# Patient Record
Sex: Male | Born: 1975 | Race: White | Hispanic: No | Marital: Married | Smoking: Never smoker
Health system: Southern US, Community
[De-identification: ages and names within clinical notes are randomized; demographics above are authoritative.]

## PROBLEM LIST (undated history)

## (undated) DIAGNOSIS — T7840XA Allergy, unspecified, initial encounter: Secondary | ICD-10-CM

## (undated) HISTORY — DX: Allergy, unspecified, initial encounter: T78.40XA

## (undated) HISTORY — PX: NASAL SINUS SURGERY: SHX719

## (undated) HISTORY — PX: HERNIA REPAIR: SHX51

---

## 2002-12-06 ENCOUNTER — Ambulatory Visit (HOSPITAL_COMMUNITY): Admission: RE | Admit: 2002-12-06 | Discharge: 2002-12-06 | Payer: Self-pay | Admitting: Surgery

## 2004-02-08 ENCOUNTER — Ambulatory Visit (HOSPITAL_BASED_OUTPATIENT_CLINIC_OR_DEPARTMENT_OTHER): Admission: RE | Admit: 2004-02-08 | Discharge: 2004-02-08 | Payer: Self-pay | Admitting: Orthopedic Surgery

## 2008-02-06 ENCOUNTER — Encounter: Admission: RE | Admit: 2008-02-06 | Discharge: 2008-02-06 | Payer: Self-pay | Admitting: Orthopedic Surgery

## 2010-08-22 ENCOUNTER — Other Ambulatory Visit: Payer: Self-pay | Admitting: Otolaryngology

## 2010-08-22 ENCOUNTER — Ambulatory Visit
Admission: RE | Admit: 2010-08-22 | Discharge: 2010-08-22 | Disposition: A | Discharge status: Home / Self Care | Payer: BC Managed Care – PPO | Source: Ambulatory Visit | Attending: Otolaryngology | Admitting: Otolaryngology

## 2010-08-22 DIAGNOSIS — J329 Chronic sinusitis, unspecified: Secondary | ICD-10-CM

## 2010-09-21 NOTE — Op Note (Signed)
NAME:  CERRONE, DEBOLD NO.:  [id number]   MEDICAL RECORD NO.:  [id number]                   PATIENT TYPE:  AMB   LOCATION:  DAY                                  FACILITY:  [facility]   PHYSICIAN:  Abigail Miyamoto, M.D.              DATE OF BIRTH:  21-Oct-1975   DATE OF PROCEDURE:  12/06/2002  DATE OF DISCHARGE:                                 OPERATIVE REPORT   PREOPERATIVE DIAGNOSIS:  Right inguinal hernia.   POSTOPERATIVE DIAGNOSIS:  Right inguinal hernia.   PROCEDURE:  Laparoscopic right inguinal hernia repair with mesh.   SURGEON:  Douglas A. Magnus Ivan, M.D.   ANESTHESIA:  General endotracheal anesthesia and 0.25% Marcaine.   ESTIMATED BLOOD LOSS:  Minimal.   PROCEDURE IN DETAIL:  The patient was brought to the operating room and  identified as Wyatt Clark.  He was placed supine on the operating table,  and general anesthesia was induced.  His abdomen was then prepped and draped  in the usual sterile fashion.  Using a #15 blade, a small transverse  incision was made below the umbilicus.  The incision was carried down to the  fascia which was then opened with a scalpel.  A hemostat was used to  identify the rectus muscles.  The rectus sheath were then elevated.  The  dissecting balloon was then passed underneath the rectus sheath and  manipulated toward the pelvis.  The dissecting balloon was then insufflated  under direct vision, dissecting out the preperitoneal space.  Excellent  dissection of the preperitoneal space appeared to be achieved.  The  dissecting balloon was then removed, and the smaller balloon port was placed  at the incision at the umbilicus.  Insufflation with carbon dioxide was then  begun, insufflating the preperitoneal space.  Next, two 5 mm ports were  placed in the patient's midline under direct vision.  Dissection was then  carried out in the right inguinal area.  The testicular cord and its  structures were easily  identified.  The patient was found to have an  indirect hernia sac which was easily reduced and separated from the  remaining cord structures.  The external ring also appeared to be quite  large in size.  No direct hernia defect was identified.  Once Cooper's  ligament was identified, a piece of Bard precut mesh was brought onto the  field.  The mesh was then placed through the port at the umbilicus.  The  mesh was then opened up and placed in an onlay fashion over the cord  structures, hernia defect, and ring.  The mesh was then tacked in place with  the surgical tacker, tacking it to Cooper's ligament, up the medial  abdominal wall, and out laterally.  Again, an excellent coverage of the cord  structures appeared to be achieved.  At this point, all ports were removed,  and the preperitoneal space was  seen to collapse appropriately.  The port at  the umbilicus then removed likewise.  The fascia at the umbilicus was then  closed with a figure-of-eight 0 Vicryl suture.  All incisions were then  anesthetized with 0.25% Marcaine.  An ilioinguinal nerve block was also  placed with 0.25% Marcaine.  All incisions were then closed with 4-0  Monocryl subcuticular sutures.  Steri-Strips, gauze, and tape were then  applied.  The patient tolerated the procedure well.  All sponge, needle, and  instrument counts were correct at the end of the procedure.  The patient was  then extubated in the operating room and taken in stable condition to the  recovery room.                                              Abigail Miyamoto, M.D.   DB/MEDQ  D:  12/06/2002  T:  12/06/2002  Job:  [id number]

## 2010-09-21 NOTE — Op Note (Signed)
NAME:  Wyatt Clark, Wyatt Clark               ACCOUNT NO.:  [id number]   MEDICAL RECORD NO.:  [id number]          PATIENT TYPE:  AMB   LOCATION:  DSC                          FACILITY:  [facility]   PHYSICIAN:  Mila Homer. Sherlean Foot, M.D. DATE OF BIRTH:  04-19-1976   DATE OF PROCEDURE:  02/08/2004  DATE OF DISCHARGE:                                 OPERATIVE REPORT   PREOPERATIVE DIAGNOSIS:  Right knee internal derangement.   POSTOPERATIVE DIAGNOSIS:  Right knee partial anterior cruciate ligament tear  and lateral meniscal tear.   OPERATION PERFORMED:   SURGEON:  Mila Homer. Sherlean Foot, M.D.   ASSISTANT:  None.   ANESTHESIA:  General.   INDICATIONS FOR PROCEDURE:  The patient is a 35 year old white male with  mechanical symptoms but a normal MRI scan.  This was primarily a diagnostic  arthroscopy because the patient could not tolerate the pain, the mechanical  symptoms and instability any further.  Informed consent was obtained.   DESCRIPTION OF PROCEDURE:  The patient was laid supine and administered  general LMA anesthesia.  Right lower extremity was prepped and draped in the  usual sterile fashion.  Inferolateral and inferomedial portals were created  with a #11 blade, blunt trocar and cannula.  Diagnostic arthroscopy  revealed a completely normal patellofemoral joint.  The medial compartment  was also completely normal.  As I went into the notch, I noticed that the  ACL appeared to be lying down on the PCL and there was almost somewhat of an  empty lateral wall sign. There was also some redundant tissue in the  anterolateral bundle.  As I went into the figure four position, I noticed  that it sort of flopped into the joint and this was definitely indicative of  some partial thickness tearing.  In my mind, I think that the ACL probably  at least 50 to 75% tore and scarred down within the synovial sheath down to  the PCL offering some artificial stability which is why it was not picked up  on MRI or  physical examination.  Under direct visualization with the  arthroscopy in the knee, there was really on half grade to 1+ grade positive  Lachman.  I then notice a radial tear in the midportion of the lateral  meniscus.  I used straight and curved basket forceps and a [place]  shaver to perform a partial lateral meniscectomy.  I also used the  ArthroCare shrinkage capture wand to complete the meniscectomy back to a  stable rim of tissue.  I then shrunk the anterolateral bundle of the ACL  mainly because of its redundancy, I felt it was causing impingement, but  this also afforded some added stability to the ACL.  I did not make an  effort to go to the posterior bundle however.  At this point I irrigated  closed with interrupted 4-0 nylon sutures, dressed with Adaptic, 4 x 4s,  sterile Webril and an Ace wrap.   COMPLICATIONS:  None.   DRAINS:  None.       SDL/MEDQ  D:  02/08/2004  T:  02/08/2004  Job:  [id number]

## 2012-09-09 ENCOUNTER — Telehealth: Payer: Self-pay | Admitting: Family Medicine

## 2012-09-09 NOTE — Telephone Encounter (Signed)
Pt's allergist has already called him in something

## 2012-11-11 ENCOUNTER — Encounter: Payer: Self-pay | Admitting: Family Medicine

## 2012-11-11 ENCOUNTER — Ambulatory Visit (INDEPENDENT_AMBULATORY_CARE_PROVIDER_SITE_OTHER): Payer: BC Managed Care – PPO | Admitting: Family Medicine

## 2012-11-11 VITALS — BP 138/90 | HR 100 | Temp 97.7°F | Resp 20 | Wt 208.0 lb

## 2012-11-11 DIAGNOSIS — F429 Obsessive-compulsive disorder, unspecified: Secondary | ICD-10-CM

## 2012-11-11 DIAGNOSIS — F439 Reaction to severe stress, unspecified: Secondary | ICD-10-CM

## 2012-11-11 DIAGNOSIS — J309 Allergic rhinitis, unspecified: Secondary | ICD-10-CM | POA: Insufficient documentation

## 2012-11-11 DIAGNOSIS — I10 Essential (primary) hypertension: Secondary | ICD-10-CM

## 2012-11-11 DIAGNOSIS — Z733 Stress, not elsewhere classified: Secondary | ICD-10-CM

## 2012-11-11 MED ORDER — ALPRAZOLAM 0.5 MG PO TABS: 0.5000 mg | ORAL_TABLET | Freq: Every evening | ORAL | Status: DC | PRN

## 2012-11-11 MED ORDER — ESCITALOPRAM OXALATE 10 MG PO TABS: 10.0000 mg | ORAL_TABLET | Freq: Every day | ORAL | Status: DC

## 2012-11-11 NOTE — Progress Notes (Signed)
  Subjective:    Patient ID: Wyatt Clark, male    DOB: 1976-02-02, 37 y.o.   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 He was in severe pain at the time. 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  Today his blood pressure is Wyatt Clark elevated at 138/90. He denies chest pain shortness of breath or dyspnea on exertion. However he reports depression, anxiety, and insomnia. Due to the recent loss of his business he cannot sleep at night. He is under tremendous stress. 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  He states that his OCD and cannot turn his mind off at night. He stress and recent back pain is what is elevating his blood pressure. Review of his past medical records showed normal blood pressures. He does have a family history of hypertension on his father's side.  He's asking for help in treating his stress. Past Medical History  Diagnosis Date  . Allergy    No current outpatient prescriptions on file prior to visit.   No current facility-administered medications on file prior to visit.   No Known Allergies History   Social History  . Marital Status: Single    Spouse Name: N/A    Number of Children: N/A  . Years of Education: N/A   Occupational History  . Not on file.   Social History Main Topics  . Smoking status: Never Smoker   . Smokeless tobacco: Not on file  . Alcohol Use: Not on file  . Drug Use: Not on file  . Sexually Active: Not on file   Other Topics Concern  . Not on file   Social History Narrative  . No narrative on file      Review of Systems  All other systems reviewed and are negative.       Objective:   Physical Exam  Vitals reviewed. Constitutional: He is oriented to person, place, and time. He appears well-developed and well-nourished.  Neck: Neck supple. No JVD present.  Cardiovascular: Normal rate, regular  rhythm and normal heart sounds.   No murmur heard. Pulmonary/Chest: Effort normal and breath sounds normal. No respiratory distress. He has no wheezes. He has no rales.  Lymphadenopathy:    He has no cervical adenopathy.  Neurological: He is alert and oriented to person, place, and time.          Assessment & Plan:  HTN (hypertension)  OCD (obsessive compulsive disorder)  Situational stress  I have asked the patient to get a blood pressure cuff and start recording his blood pressures daily. If they're consistently higher than 140/90 we will need to treat them. In the meantime we will try to address his situational stress and help him deal with his OCD tendencies. Start the patient on Lexapro 10 mg by mouth daily. I gave the patient Xanax 0.5 mg by mouth each bedtime when necessary insomnia. I will see the patient back in one month to check and see how his stress is improving. He denies any suicidal ideation. If his blood pressures remain elevated we will treat them. If they become emergently elevated greater than 160/100 we'll begin treatment immediately.

## 2012-12-14 ENCOUNTER — Ambulatory Visit: Payer: BC Managed Care – PPO | Admitting: Family Medicine

## 2013-01-01 ENCOUNTER — Ambulatory Visit: Payer: BC Managed Care – PPO | Admitting: Family Medicine

## 2013-05-18 ENCOUNTER — Ambulatory Visit (INDEPENDENT_AMBULATORY_CARE_PROVIDER_SITE_OTHER): Payer: BC Managed Care – PPO | Admitting: Physician Assistant

## 2013-05-18 ENCOUNTER — Encounter: Payer: Self-pay | Admitting: Physician Assistant

## 2013-05-18 VITALS — BP 134/90 | HR 84 | Temp 97.9°F | Resp 20 | Wt 204.0 lb

## 2013-05-18 DIAGNOSIS — J029 Acute pharyngitis, unspecified: Secondary | ICD-10-CM

## 2013-05-18 DIAGNOSIS — R509 Fever, unspecified: Secondary | ICD-10-CM

## 2013-05-18 DIAGNOSIS — J101 Influenza due to other identified influenza virus with other respiratory manifestations: Secondary | ICD-10-CM

## 2013-05-18 DIAGNOSIS — J111 Influenza due to unidentified influenza virus with other respiratory manifestations: Secondary | ICD-10-CM

## 2013-05-18 LAB — INFLUENZA A AND B
INFLUENZA B AG: NEGATIVE
Inflenza A Ag: POSITIVE — AB

## 2013-05-18 LAB — RAPID STREP SCREEN (MED CTR MEBANE ONLY): Streptococcus, Group A Screen (Direct): NEGATIVE

## 2013-05-18 MED ORDER — OSELTAMIVIR PHOSPHATE 75 MG PO CAPS: 75.0000 mg | ORAL_CAPSULE | Freq: Two times a day (BID) | ORAL | Status: DC

## 2013-05-18 NOTE — Progress Notes (Signed)
Patient ID: Isaias SakaiLeonard Ouk MRN: [id number], DOB: 03/26/1976, 38 y.o. Date of Encounter: 05/18/2013, 10:44 AM    Chief Complaint:  Chief Complaint  Patient presents with  . sick    fever, cough, congestion, sore throat  . Medication Refill    xanax     HPI: 38 y.o. year old white male reports that his was room with him last week with a viral sore throat. He took his son to his pediatrician who had said it was a virus and the sore throat did improve after just 3 days. Patient states that he also had some sore throat last week. However, he says that all of a sudden Sunday night which was 05/16/13, he suddenly felt terrible. Developed a fever of 101. Says that yesterday he continued to feel horrible all day with complete very weak with no energy and with it bad cough. Has had no head or nasal congestion no nasal mucus. Sore Throat is resolved since last week.   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 Says he has no problems falling asleep but frequently wakes up after just a couple hours of sleep. His mind starts taking about things with his business. Then has difficulty falling back to sleep. Does use Xanax at night and this does work but he is concerned about using this long-term. Office visit Tylenol PM works well but he is concerned about his liver.     Home Meds: See attached medication section for any medications that were entered at today's visit. The computer does not put those onto this list.The following list is a list of meds entered prior to today's visit.   Current Outpatient Prescriptions on File Prior to Visit  Medication Sig Dispense Refill  . ALPRAZolam (XANAX) 0.5 MG tablet Take 1 tablet (0.5 mg total) by mouth at bedtime as needed for sleep.  30 tablet  0  . cetirizine (ZYRTEC) 5 MG tablet Take 5 mg by mouth daily.      Marland Kitchen. escitalopram (LEXAPRO) 10 MG tablet Take 1 tablet (10 mg total) by mouth daily.  30 tablet  3   No current facility-administered medications  on file prior to visit.    Allergies: No Known Allergies    Review of Systems: See HPI for pertinent ROS. All other ROS negative.    Physical Exam: Blood pressure 134/90, pulse 84, temperature 97.9 F (36.6 C), temperature source Oral, resp. rate 20, weight 204 lb (92.534 kg)., There is no height on file to calculate BMI. General: WNWD WM. Appears in no acute distress. HEENT: Normocephalic, atraumatic, eyes without discharge, sclera non-icteric, nares are without discharge. Bilateral auditory canals clear, TM's are without perforation, pearly grey and translucent with reflective cone of light bilaterally. Oral cavity moist, posterior pharynx without exudate, erythema, peritonsillar abscess.  Neck: Supple. No thyromegaly. No lymphadenopathy. Lungs: Clear bilaterally to auscultation without wheezes, rales, or rhonchi. Breathing is unlabored. Heart: Regular rhythm. No murmurs, rubs, or gallops. Msk:  Strength and tone normal for age. Extremities/Skin: Warm and dry. No clubbing or cyanosis. No edema. No rashes or suspicious lesions. Neuro: Alert and oriented X 3. Moves all extremities spontaneously. Gait is normal. CNII-XII grossly in tact. Psych:  Responds to questions appropriately with a normal affect.   Results for orders placed in visit on 05/18/13  RAPID STREP SCREEN      Result Value Range   Source THROAT     Streptococcus, Group A Screen (Direct) NEG  NEGATIVE  INFLUENZA A AND  B      Result Value Range   Source-INFBD NASAL     Inflenza A Ag POS (*) Negative   Influenza B Ag NEG  Negative     ASSESSMENT AND PLAN:  38 y.o. year old male with  1. Influenza A - oseltamivir (TAMIFLU) 75 MG capsule; Take 1 capsule (75 mg total) by mouth 2 (two) times daily.  Dispense: 10 capsule; Refill: 0  2. Fever, unspecified - Rapid Strep Screen - Influenza a and b  3. Sore throat - Rapid Strep Screen - Influenza a and b  Start the Tamiflu immediately. Take as directed and complete.  Can use Tylenol or Motrin as needed for fever and aches and pains. Follow up if symptoms worsen significantly or do not improve and resolve over the upcoming week.   Anxiety/Insomnia:  Told him to take plain Benadryl at night rather than Tylenol PM. See history of present illness.  [address] [place], [place], [facility] 05/18/2013 10:44 AM

## 2013-06-04 ENCOUNTER — Telehealth: Payer: Self-pay | Admitting: Family Medicine

## 2013-06-04 ENCOUNTER — Encounter: Payer: Self-pay | Admitting: Family Medicine

## 2013-06-04 ENCOUNTER — Ambulatory Visit (INDEPENDENT_AMBULATORY_CARE_PROVIDER_SITE_OTHER): Payer: BC Managed Care – PPO | Admitting: Family Medicine

## 2013-06-04 VITALS — BP 140/100 | HR 78 | Temp 97.5°F | Resp 18 | Ht 69.5 in | Wt 210.0 lb

## 2013-06-04 DIAGNOSIS — J019 Acute sinusitis, unspecified: Secondary | ICD-10-CM

## 2013-06-04 DIAGNOSIS — J029 Acute pharyngitis, unspecified: Secondary | ICD-10-CM

## 2013-06-04 DIAGNOSIS — R030 Elevated blood-pressure reading, without diagnosis of hypertension: Secondary | ICD-10-CM | POA: Insufficient documentation

## 2013-06-04 LAB — RAPID STREP SCREEN (MED CTR MEBANE ONLY): STREPTOCOCCUS, GROUP A SCREEN (DIRECT): NEGATIVE

## 2013-06-04 MED ORDER — MAGIC MOUTHWASH W/LIDOCAINE: 5.0000 mL | Freq: Three times a day (TID) | ORAL | Status: DC | PRN

## 2013-06-04 MED ORDER — AMOXICILLIN-POT CLAVULANATE 875-125 MG PO TABS: 1.0000 | ORAL_TABLET | Freq: Two times a day (BID) | ORAL | Status: DC

## 2013-06-04 NOTE — Assessment & Plan Note (Signed)
Magic mouthwash given his strep was negative Augmentin will also help

## 2013-06-04 NOTE — Assessment & Plan Note (Signed)
With recurrent infection we'll go ahead and place him on Augmentin he can also continue Mucinex

## 2013-06-04 NOTE — Telephone Encounter (Signed)
Need clarification on strength of magic mouthwash.  Call pharmacy to clarify formula

## 2013-06-04 NOTE — Telephone Encounter (Signed)
Still sick.  Continues to have sore throat, congestion, head stuffy.  Was in bed all last week.  No fever.  Glands are swollen.  Offered appt for today to see provider

## 2013-06-04 NOTE — Patient Instructions (Signed)
Take antibiotics Continue mucinex Magic mouthwash F/U 8 WEEKS FOR BLOOD PRESSURE WITH DR. PICKWARD

## 2013-06-04 NOTE — Telephone Encounter (Signed)
Contacted pharmacy and medication was clarified

## 2013-06-04 NOTE — Telephone Encounter (Signed)
Call and clarify- magic mouthwash 5ml po TID prn  60ml

## 2013-06-04 NOTE — Progress Notes (Signed)
   Subjective:    Patient ID: Wyatt Clark, male    DOB: 04/04/1976, 38 y.o.   MRN: [id number]  HPI  Patient here with sinus drainage and sore throat for the past week. He was actually seen 3 weeks ago with sore throat he is exposed to strep throat however strep was negative he did have a positive influenza A and was started on Tamiflu. He completed the Tamiflu was feeling better for about 2 or 3 days and then he felt like his sinuses started to drain and a sore throat returned but worse. He's not had any fever. He has been taking decongestants for her sinuses for the past 2 days and using Mucinex as well.  Review of Systems  GEN- denies fatigue, fever, weight loss,weakness, recent illness HEENT- denies eye drainage, change in vision, +nasal discharge, CVS- denies chest pain, palpitations RESP- denies SOB,+ cough, wheeze Neuro- denies headache, dizziness, syncope, seizure activity      Objective:   Physical Exam  GEN- NAD, alert and oriented x3 HEENT- PERRL, EOMI, non injected sclera, pink conjunctiva, MMM, oropharynx mild injection, TM clear bilat no effusion,  maxillary sinus tenderness, +  Nasal drainage  Neck- Supple,  + anterior LAD CVS- RRR, no murmur RESP-CTAB EXT- No edema Pulses- Radial 2+        Assessment & Plan:

## 2013-06-04 NOTE — Assessment & Plan Note (Signed)
He has not been monitoring his blood pressure recently he has a lot of stress with his job which is noted by his PCP. Unfortunately is been on decongestants his elevation in blood pressure may be due to all the over-the-counter medications. I'll have him return in about 8 weeks with his PCP to have his blood pressure recheck he is also to check this at home

## 2013-06-14 ENCOUNTER — Telehealth: Payer: Self-pay | Admitting: Family Medicine

## 2013-06-14 MED ORDER — AZITHROMYCIN 250 MG PO TABS: ORAL_TABLET | ORAL | Status: DC

## 2013-06-14 NOTE — Telephone Encounter (Signed)
Pt called and states that after the antibx he is not feeling much better at all and wants to know if we can call him in a different antibx like Zpac or something. His glands are still swollen and painful, no energy and still congested. No fever.

## 2013-06-14 NOTE — Telephone Encounter (Signed)
Patient aware and med sent to pharm 

## 2013-06-14 NOTE — Telephone Encounter (Signed)
Okay to send in Zpak  Use mucinex for the congestion and nasal saline

## 2013-07-11 ENCOUNTER — Ambulatory Visit: Payer: BC Managed Care – PPO | Admitting: Family Medicine

## 2013-07-11 VITALS — BP 116/72 | HR 74 | Temp 98.5°F | Resp 16 | Ht 69.5 in | Wt 210.0 lb

## 2013-07-11 DIAGNOSIS — J019 Acute sinusitis, unspecified: Secondary | ICD-10-CM

## 2013-07-11 DIAGNOSIS — J309 Allergic rhinitis, unspecified: Secondary | ICD-10-CM

## 2013-07-11 DIAGNOSIS — J302 Other seasonal allergic rhinitis: Secondary | ICD-10-CM

## 2013-07-11 MED ORDER — AZELASTINE-FLUTICASONE 137-50 MCG/ACT NA SUSP: 1.0000 | Freq: Every day | NASAL | Status: DC

## 2013-07-11 MED ORDER — AZITHROMYCIN 250 MG PO TABS: ORAL_TABLET | ORAL | Status: DC

## 2013-07-11 NOTE — Progress Notes (Signed)
Chief Complaint:  Chief Complaint  Patient presents with  . sinus congestion    started yesturday   . sinus pressure  . Sore Throat  . Medication Refill    alprazolam ( usually filled at [facility])    HPI: Wyatt Clark is a 38 y.o. male who is here for: 1. Sinus infection- 2 days , sinus pain, mucus, sinus clogged, no energy, fevers or chills.. Gets allergy shots He used to get them once a month and now very infrequently. Ran out Dymista and gets sinus issues if he does not have that 2. HE wants ot get refills for Xanax , but he gets it filled [facility])  Past Medical History  Diagnosis Date  . Allergy    Past Surgical History  Procedure Laterality Date  . Hernia repair     History   Social History  . Marital Status: Married    Spouse Name: N/A    Number of Children: N/A  . Years of Education: N/A   Social History Main Topics  . Smoking status: Never Smoker   . Smokeless tobacco: None  . Alcohol Use: None  . Drug Use: None  . Sexual Activity: None   Other Topics Concern  . None   Social History Narrative  . None   Family History  Problem Relation Age of Onset  . Hypertension Father   . Diabetes Mother   . Cancer Sister    No Known Allergies Prior to Admission medications   Medication Sig Start Date End Date Taking? Authorizing Provider  DYMISTA 137-50 MCG/ACT SUSP Place 1 spray into the nose daily. 03/31/13  Yes Historical Provider, MD  ALPRAZolam Prudy Feeler) 0.5 MG tablet Take 1 tablet (0.5 mg total) by mouth at bedtime as needed for sleep. 11/11/12   Donita Brooks, MD  Alum & Mag Hydroxide-Simeth (MAGIC MOUTHWASH W/LIDOCAINE) SOLN Take 5 mLs by mouth 3 (three) times daily as needed for mouth pain. 06/04/13   Salley Scarlet, MD  amoxicillin-clavulanate (AUGMENTIN) 875-125 MG per tablet Take 1 tablet by mouth 2 (two) times daily. 06/04/13   Salley Scarlet, MD  azithromycin (ZITHROMAX) 250 MG tablet 2 tabs po x 1 day; 1  tab po qd x days 2-5 06/14/13   Donita Brooks, MD  cetirizine (ZYRTEC) 5 MG tablet Take 5 mg by mouth daily.    Historical Provider, MD  escitalopram (LEXAPRO) 10 MG tablet Take 1 tablet (10 mg total) by mouth daily. 11/11/12   Donita Brooks, MD     ROS: The patient denies fevers, chills, night sweats, unintentional weight loss, chest pain, palpitations, wheezing, dyspnea on exertion, nausea, vomiting, abdominal pain, dysuria, hematuria, melena, numbness, weakness, or tingling.   All other systems have been reviewed and were otherwise negative with the exception of those mentioned in the HPI and as above.    PHYSICAL EXAM: Filed Vitals:   07/11/13 1253  BP: 116/72  Pulse: 74  Temp: 98.5 F (36.9 C)  Resp: 16   Filed Vitals:   07/11/13 1253  Height: 5' 9.5" (1.765 m)  Weight: 210 lb (95.255 kg)   Body mass index is 30.58 kg/(m^2).  General: Alert, no acute distress HEENT:  Normocephalic, atraumatic, EOMI, PERRLA, tm nl, + sinus tenderness Cardiovascular: No pedal edema.  NSR,no murmurs Respiratory: No cyanosis, no use of accessory musculature, CTAB GI: No organomegaly, abdomen is Skin: No rashes. Neurologic: Facial musculature symmetric. Psychiatric: Patient is appropriate throughout  our interaction. Lymphatic: No cervical lymphadenopathy Musculoskeletal: Gait intact.   LABS: Results for orders placed in visit on 06/04/13  RAPID STREP SCREEN      Result Value Ref Range   Source THROAT     Streptococcus, Group A Screen (Direct) NEG  NEGATIVE     EKG/XRAY:   [facility]   ASSESSMENT/PLAN: Encounter Diagnoses  Name Primary?  . Acute sinusitis Yes  . Seasonal allergies    Rx Z pack and Dymista Advise to call [facility] for xanax refills F/u prn   Gross sideeffects, risk and benefits, and alternatives of medications d/w patient. Patient is aware that all medications have potential sideeffects and we are unable to predict  every sideeffect or drug-drug interaction that may occur.  Gracee Ratterree PHUONG, DO 07/11/2013 1:34 PM

## 2013-07-15 ENCOUNTER — Telehealth: Payer: Self-pay

## 2013-07-15 NOTE — Telephone Encounter (Signed)
PA needed for Dymista NS. Called pt who reported he has tried/failed nasacort, flonase,nasonex,astelin and rhinocort. All caused nosebleeds. dymista is effective w/out epistasis. Completed PA on covermymeds.

## 2013-07-21 NOTE — Telephone Encounter (Signed)
[facility] [employer] faxed notice that a PA was on file through 07/15/14. Called pharm to check if Rx would be covered and was told ins covers it, but co-pay is over $100. Advised pt of this and he stated there wasn't anything else that had worked for him. I suggested he call cust serv at [facility] [employer] to see if anything can be done to reduce the cost for him and let me know if we can help.

## 2013-10-15 ENCOUNTER — Ambulatory Visit (INDEPENDENT_AMBULATORY_CARE_PROVIDER_SITE_OTHER): Payer: BC Managed Care – PPO | Admitting: Family Medicine

## 2013-10-15 ENCOUNTER — Telehealth: Payer: Self-pay | Admitting: Family Medicine

## 2013-10-15 ENCOUNTER — Encounter: Payer: Self-pay | Admitting: Family Medicine

## 2013-10-15 VITALS — BP 140/100 | HR 76 | Temp 97.1°F | Resp 16 | Ht 69.5 in | Wt 200.0 lb

## 2013-10-15 DIAGNOSIS — J019 Acute sinusitis, unspecified: Secondary | ICD-10-CM

## 2013-10-15 MED ORDER — PREDNISONE 20 MG PO TABS: ORAL_TABLET | ORAL | Status: DC

## 2013-10-15 MED ORDER — CEFDINIR 300 MG PO CAPS: 300.0000 mg | ORAL_CAPSULE | Freq: Two times a day (BID) | ORAL | Status: DC

## 2013-10-15 MED ORDER — ALPRAZOLAM 0.5 MG PO TABS: 0.5000 mg | ORAL_TABLET | Freq: Every evening | ORAL | Status: DC | PRN

## 2013-10-15 NOTE — Progress Notes (Signed)
   Subjective:    Patient ID: Wyatt Clark, male    DOB: 09/09/1975, 38 y.o.   MRN: [id number]  HPI Patient history of chronic allergic rhinitis. He's been on his allergy injections for several months. Last month his allergies have gotten severe. He has constant rhinorrhea constant sinus pressure constant postnasal drip. He has been taking his dymista but the symptoms are becoming severe. He now has right maxillary sinus pressure and right frontal sinus pressure and pain. This been present for over 2 weeks. He has a dull constant headache. He has subjective fevers. His blood pressure is also extremely elevated today. Patient states he is under tremendous stress. He is not sleeping well at night. Past Medical History  Diagnosis Date  . Allergy    Current Outpatient Prescriptions on File Prior to Visit  Medication Sig Dispense Refill  . ALPRAZolam (XANAX) 0.5 MG tablet Take 1 tablet (0.5 mg total) by mouth at bedtime as needed for sleep.  30 tablet  0   No current facility-administered medications on file prior to visit.   No Known Allergies History   Social History  . Marital Status: Married    Spouse Name: N/A    Number of Children: N/A  . Years of Education: N/A   Occupational History  . Not on file.   Social History Main Topics  . Smoking status: Never Smoker   . Smokeless tobacco: Not on file  . Alcohol Use: Not on file  . Drug Use: Not on file  . Sexual Activity: Not on file   Other Topics Concern  . Not on file   Social History Narrative  . No narrative on file      Review of Systems  All other systems reviewed and are negative.      Objective:   Physical Exam  Constitutional: He appears well-developed and well-nourished.  HENT:  Nose: Mucosal edema and rhinorrhea present. Right sinus exhibits maxillary sinus tenderness and frontal sinus tenderness.  Mouth/Throat: Oropharynx is clear and moist. No oropharyngeal exudate.  Eyes: Conjunctivae are normal.    Neck: Neck supple.  Cardiovascular: Normal rate, regular rhythm and normal heart sounds.   Pulmonary/Chest: Effort normal and breath sounds normal. No respiratory distress. He has no wheezes. He has no rales.          Assessment & Plan:  1. Acute rhinosinusitis Patient has allergic rhinosinusitis which has developed into a secondary bacterial sinusitis. Begin a prednisone taper pack along with Omnicef 300 mg by mouth twice a day for 10 days. Follow up with his allergist to resume his allergy shots.  I also asked the patient check his blood pressure and home daily for the next week and report the values to me. His blood pressures greater than 140/90 on start the patient on medicine. He has a blood pressure cuff at home and he would like to do this prior to starting any medication. - cefdinir (OMNICEF) 300 MG capsule; Take 1 capsule (300 mg total) by mouth 2 (two) times daily.  Dispense: 20 capsule; Refill: 0 - predniSONE (DELTASONE) 20 MG tablet; 3 tabs poqday 1-2, 2 tabs poqday 3-4, 1 tab poqday 5-6  Dispense: 12 tablet; Refill: 0

## 2013-10-15 NOTE — Addendum Note (Signed)
Addended by: Legrand RamsWILLIS, SANDY B on: 10/15/2013 12:40 PM   Modules accepted: Orders

## 2013-11-20 ENCOUNTER — Ambulatory Visit (INDEPENDENT_AMBULATORY_CARE_PROVIDER_SITE_OTHER): Payer: BC Managed Care – PPO | Admitting: Family Medicine

## 2013-11-20 VITALS — BP 116/76 | HR 66 | Temp 98.4°F | Resp 20 | Ht 70.0 in | Wt 201.4 lb

## 2013-11-20 DIAGNOSIS — J329 Chronic sinusitis, unspecified: Secondary | ICD-10-CM

## 2013-11-20 MED ORDER — AMOXICILLIN-POT CLAVULANATE 875-125 MG PO TABS: 1.0000 | ORAL_TABLET | Freq: Two times a day (BID) | ORAL | Status: DC

## 2013-11-20 MED ORDER — METHYLPREDNISOLONE ACETATE 40 MG/ML IJ SUSP
80.0000 mg | Freq: Once | INTRAMUSCULAR | Status: AC
  Administered 2013-11-20: 80 mg via INTRAMUSCULAR

## 2013-11-20 NOTE — Patient Instructions (Signed)
Sinusitis Sinusitis is redness, soreness, and swelling (inflammation) of the paranasal sinuses. Paranasal sinuses are air pockets within the bones of your face (beneath the eyes, the middle of the forehead, or above the eyes). In healthy paranasal sinuses, mucus is able to drain out, and air is able to circulate through them by way of your nose. However, when your paranasal sinuses are inflamed, mucus and air can become trapped. This can allow bacteria and other germs to grow and cause infection. Sinusitis can develop quickly and last only a short time (acute) or continue over a long period (chronic). Sinusitis that lasts for more than 12 weeks is considered chronic.  CAUSES  Causes of sinusitis include:  Allergies.  Structural abnormalities, such as displacement of the cartilage that separates your nostrils (deviated septum), which can decrease the air flow through your nose and sinuses and affect sinus drainage.  Functional abnormalities, such as when the small hairs (cilia) that line your sinuses and help remove mucus do not work properly or are not present. SYMPTOMS  Symptoms of acute and chronic sinusitis are the same. The primary symptoms are pain and pressure around the affected sinuses. Other symptoms include:  Upper toothache.  Earache.  Headache.  Bad breath.  Decreased sense of smell and taste.  A cough, which worsens when you are lying flat.  Fatigue.  Fever.  Thick drainage from your nose, which often is green and may contain pus (purulent).  Swelling and warmth over the affected sinuses. DIAGNOSIS  Your caregiver will perform a physical exam. During the exam, your caregiver may:  Look in your nose for signs of abnormal growths in your nostrils (nasal polyps).  Tap over the affected sinus to check for signs of infection.  View the inside of your sinuses (endoscopy) with a special imaging device with a light attached (endoscope), which is inserted into your  sinuses. If your caregiver suspects that you have chronic sinusitis, one or more of the following tests may be recommended:  Allergy tests.  Nasal culture--A sample of mucus is taken from your nose and sent to a lab and screened for bacteria.  Nasal cytology--A sample of mucus is taken from your nose and examined by your caregiver to determine if your sinusitis is related to an allergy. TREATMENT  Most cases of acute sinusitis are related to a viral infection and will resolve on their own within 10 days. Sometimes medicines are prescribed to help relieve symptoms (pain medicine, decongestants, nasal steroid sprays, or saline sprays).  However, for sinusitis related to a bacterial infection, your caregiver will prescribe antibiotic medicines. These are medicines that will help kill the bacteria causing the infection.  Rarely, sinusitis is caused by a fungal infection. In theses cases, your caregiver will prescribe antifungal medicine. For some cases of chronic sinusitis, surgery is needed. Generally, these are cases in which sinusitis recurs more than 3 times per year, despite other treatments. HOME CARE INSTRUCTIONS   Drink plenty of water. Water helps thin the mucus so your sinuses can drain more easily.  Use a humidifier.  Inhale steam 3 to 4 times a day (for example, sit in the bathroom with the shower running).  Apply a warm, moist washcloth to your face 3 to 4 times a day, or as directed by your caregiver.  Use saline nasal sprays to help moisten and clean your sinuses.  Take over-the-counter or prescription medicines for pain, discomfort, or fever only as directed by your caregiver. SEEK IMMEDIATE MEDICAL CARE IF:    You have increasing pain or severe headaches.  You have nausea, vomiting, or drowsiness.  You have swelling around your face.  You have vision problems.  You have a stiff neck.  You have difficulty breathing. MAKE SURE YOU:   Understand these  instructions.  Will watch your condition.  Will get help right away if you are not doing well or get worse. Document Released: 04/22/2005 Document Revised: 07/15/2011 Document Reviewed: 05/07/2011 Exit[employer] Patient Information 2015 [employer]. This information is not intended to replace advice given to you by your health care provider. Make sure you discuss any questions you have with your health care provider.  

## 2013-11-20 NOTE — Progress Notes (Signed)
° °  Subjective:  This chart was scribed for Wyatt SidleKurt Lauenstein, MD by Carl Bestelina Holson, Medical Scribe. This patient was seen in Room 10 and the patient's care was started at 3:49 PM.   Patient ID: Wyatt Clark Linhardt, male    DOB: 07/04/1975, 38 y.o.   MRN: [id number]  HPI HPI Comments: Wyatt Clark Wyatt Clark is a 38 y.o. male who presents to the [facility] complaining of constant sinusitis that started yesterday.  He denies fever and cough as associated symptoms.  The patient states that his son recently had a cold.  He states that he did not get his allergy shots because of his symptoms.  He states that he has experienced these symptoms in the past and received a cortisol shot which provided complete relief to his symptoms and omnicef.  [facility], [facility].  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  Past Medical History  Diagnosis Date   Allergy    Past Surgical History  Procedure Laterality Date   Hernia repair     Family History  Problem Relation Age of Onset   Hypertension Father    Diabetes Mother    Cancer Sister    History   Social History   Marital Status: Married    Spouse Name: N/A    Number of Children: N/A   Years of Education: N/A   Occupational History   Not on file.   Social History Main Topics   Smoking status: Never Smoker    Smokeless tobacco: Never Used   Alcohol Use: Yes   Drug Use: No   Sexual Activity: Not on file   Other Topics Concern   Not on file   Social History Narrative   No narrative on file   No Known Allergies  Review of Systems  Constitutional: Negative for fever.  HENT: Positive for sinus pressure.   Respiratory: Negative for cough.      Objective:  Physical Exam  Nursing note and vitals reviewed. Constitutional: He is oriented to person, place, and time. He appears well-developed and well-nourished.  HENT:  Head: Normocephalic and atraumatic.  Swollen and  erythematous nasal passages bilaterally.  Eyes: EOM are normal.  Neck: Normal range of motion.  Cardiovascular: Normal rate.   Pulmonary/Chest: Effort normal.  Musculoskeletal: Normal range of motion.  Neurological: He is alert and oriented to person, place, and time.  Skin: Skin is warm and dry.  Psychiatric: He has a normal mood and affect. His behavior is normal.     BP 116/76   Pulse 66   Temp(Src) 98.4 F (36.9 C) (Oral)   Resp 20   Ht 5\' 10"  (1.778 m)   Wt 201 lb 6 oz (91.343 kg)   BMI 28.89 kg/m2   SpO2 97% Assessment & Plan:    I personally performed the services described in this documentation, which was scribed in my presence. The recorded information has been reviewed and is accurate.  Unspecified sinusitis (chronic) - Plan: amoxicillin-clavulanate (AUGMENTIN) 875-125 MG per tablet, methylPREDNISolone acetate (DEPO-MEDROL) injection 80 mg  KL, MD

## 2014-01-17 ENCOUNTER — Ambulatory Visit (INDEPENDENT_AMBULATORY_CARE_PROVIDER_SITE_OTHER): Payer: BC Managed Care – PPO | Admitting: Family Medicine

## 2014-01-17 ENCOUNTER — Encounter: Payer: Self-pay | Admitting: Family Medicine

## 2014-01-17 VITALS — BP 148/100 | HR 78 | Temp 98.2°F | Resp 16 | Ht 70.0 in | Wt 209.0 lb

## 2014-01-17 DIAGNOSIS — J019 Acute sinusitis, unspecified: Secondary | ICD-10-CM

## 2014-01-17 MED ORDER — AMOXICILLIN-POT CLAVULANATE 875-125 MG PO TABS: 1.0000 | ORAL_TABLET | Freq: Two times a day (BID) | ORAL | Status: DC

## 2014-01-17 NOTE — Progress Notes (Signed)
   Subjective:    Patient ID: Wyatt Clark, male    DOB: 09/30/1975, 38 y.o.   MRN: [id number]  HPI Patient has had right maxillary and right frontal sinus pain and pressure for 5 days. He has developed nasal drainage. He has postnasal drip and scratchy throat. He has cough productive of green sputum. He is having a constant moderate to severe headache in his right frontal and right maxillary sinus area. Past Medical History  Diagnosis Date  . Allergy    Past Surgical History  Procedure Laterality Date  . Hernia repair     No current outpatient prescriptions on file prior to visit.   No current facility-administered medications on file prior to visit.   No Known Allergies History   Social History  . Marital Status: Married    Spouse Name: N/A    Number of Children: N/A  . Years of Education: N/A   Occupational History  . Not on file.   Social History Main Topics  . Smoking status: Never Smoker   . Smokeless tobacco: Never Used  . Alcohol Use: Yes  . Drug Use: No  . Sexual Activity: Not on file   Other Topics Concern  . Not on file   Social History Narrative  . No narrative on file      Review of Systems  All other systems reviewed and are negative.      Objective:   Physical Exam  Vitals reviewed. Constitutional: He appears well-developed and well-nourished.  HENT:  Right Ear: Tympanic membrane, external ear and ear canal normal.  Left Ear: Tympanic membrane, external ear and ear canal normal.  Nose: Mucosal edema and rhinorrhea present. Right sinus exhibits maxillary sinus tenderness and frontal sinus tenderness.  Mouth/Throat: Oropharynx is clear and moist. No oropharyngeal exudate.  Neck: Neck supple.  Cardiovascular: Normal rate, regular rhythm and normal heart sounds.   No murmur heard. Pulmonary/Chest: Effort normal and breath sounds normal. No respiratory distress. He has no wheezes. He has no rales.  Lymphadenopathy:    He has no cervical  adenopathy.          Assessment & Plan:  Acute rhinosinusitis - Plan: amoxicillin-clavulanate (AUGMENTIN) 875-125 MG per tablet  Begin Augmentin 875 mg by mouth twice a day for 10 days. Use Mucinex DM for cough and congestion. Blood pressure is elevated due to the sinus medication he is taking.  Discontinue Sudafed.

## 2014-02-11 ENCOUNTER — Other Ambulatory Visit: Payer: Self-pay | Admitting: Orthopedic Surgery

## 2014-02-11 DIAGNOSIS — M25312 Other instability, left shoulder: Secondary | ICD-10-CM

## 2014-02-11 DIAGNOSIS — M25512 Pain in left shoulder: Secondary | ICD-10-CM

## 2014-03-01 ENCOUNTER — Ambulatory Visit
Admission: RE | Admit: 2014-03-01 | Discharge: 2014-03-01 | Disposition: A | Discharge status: Home / Self Care | Payer: BC Managed Care – PPO | Source: Ambulatory Visit | Attending: Orthopedic Surgery | Admitting: Orthopedic Surgery

## 2014-03-01 DIAGNOSIS — M25512 Pain in left shoulder: Secondary | ICD-10-CM

## 2014-03-01 DIAGNOSIS — M25312 Other instability, left shoulder: Secondary | ICD-10-CM

## 2014-03-01 MED ORDER — IOHEXOL 180 MG/ML  SOLN
15.0000 mL | Freq: Once | INTRAMUSCULAR | Status: AC | PRN
  Administered 2014-03-01: 15 mL via INTRA_ARTICULAR

## 2014-03-03 ENCOUNTER — Encounter: Payer: Self-pay | Admitting: Family Medicine

## 2014-03-03 ENCOUNTER — Ambulatory Visit (INDEPENDENT_AMBULATORY_CARE_PROVIDER_SITE_OTHER): Payer: BC Managed Care – PPO | Admitting: Family Medicine

## 2014-03-03 VITALS — BP 130/74 | HR 74 | Temp 97.6°F | Resp 18 | Ht 70.0 in | Wt 204.0 lb

## 2014-03-03 DIAGNOSIS — J019 Acute sinusitis, unspecified: Secondary | ICD-10-CM

## 2014-03-03 MED ORDER — MONTELUKAST SODIUM 10 MG PO TABS: 10.0000 mg | ORAL_TABLET | Freq: Every day | ORAL | Status: DC

## 2014-03-03 MED ORDER — METHYLPREDNISOLONE ACETATE 80 MG/ML IJ SUSP
80.0000 mg | Freq: Once | INTRAMUSCULAR | Status: AC
  Administered 2014-03-03: 60 mg via INTRAMUSCULAR

## 2014-03-03 MED ORDER — AZELASTINE-FLUTICASONE 137-50 MCG/ACT NA SUSP: 1.0000 | Freq: Every day | NASAL | Status: DC

## 2014-03-03 MED ORDER — AMOXICILLIN-POT CLAVULANATE 875-125 MG PO TABS: 1.0000 | ORAL_TABLET | Freq: Two times a day (BID) | ORAL | Status: DC

## 2014-03-03 NOTE — Progress Notes (Signed)
   Subjective:    Patient ID: Wyatt Clark, male    DOB: 07/13/1975, 38 y.o.   MRN: [id number]  HPI  Patient is a 38 year old white male with a history of chronic sinusitis. He has had previous sinus surgery. He is on weekly allergy shots. He continues to deal with chronic sinus pressure and drainage. Over the last several days he is developed severe pain in both maxillary sinuses, subjective fever, and a dull headache. He is having postnasal drip and a cough due to postnasal drip. This is the fourth or fifth time the patient has had sinusitis in the last year. He is requesting a steroid injection. Past Medical History  Diagnosis Date  . Allergy    Past Surgical History  Procedure Laterality Date  . Hernia repair     No current outpatient prescriptions on file prior to visit.   No current facility-administered medications on file prior to visit.   No Known Allergies History   Social History  . Marital Status: Married    Spouse Name: N/A    Number of Children: N/A  . Years of Education: N/A   Occupational History  . Not on file.   Social History Main Topics  . Smoking status: Never Smoker   . Smokeless tobacco: Never Used  . Alcohol Use: Yes  . Drug Use: No  . Sexual Activity: Not on file   Other Topics Concern  . Not on file   Social History Narrative  . No narrative on file     Review of Systems  All other systems reviewed and are negative.      Objective:   Physical Exam  Vitals reviewed. Constitutional: He appears well-developed and well-nourished. No distress.  HENT:  Head: Normocephalic and atraumatic.  Right Ear: Tympanic membrane, external ear and ear canal normal.  Left Ear: Tympanic membrane, external ear and ear canal normal.  Nose: Mucosal edema and rhinorrhea present. Right sinus exhibits maxillary sinus tenderness and frontal sinus tenderness. Left sinus exhibits maxillary sinus tenderness and frontal sinus tenderness.  Mouth/Throat: Oropharynx  is clear and moist.  Eyes: Conjunctivae are normal. Pupils are equal, round, and reactive to light.  Neck: Normal range of motion. Neck supple.  Cardiovascular: Normal rate, regular rhythm and normal heart sounds.   Pulmonary/Chest: Effort normal and breath sounds normal. No respiratory distress. He has no wheezes. He has no rales.  Lymphadenopathy:    He has no cervical adenopathy.  Skin: He is not diaphoretic.          Assessment & Plan:  Acute rhinosinusitis - Plan: Azelastine-Fluticasone (DYMISTA) 137-50 MCG/ACT SUSP, montelukast (SINGULAIR) 10 MG tablet, amoxicillin-clavulanate (AUGMENTIN) 875-125 MG per tablet, methylPREDNISolone acetate (DEPO-MEDROL) injection 80 mg  Patient has acute rhinosinusitis. I agreed to give him Depo-Medrol 60 mg IM 1. Also prescribed the patient Augmentin 875 mg by mouth twice a day for 10 days. I explained to him the risk of chronic steroid use and also the risk of overuse of antibiotics. I explained to the patient that we need to focus better on prevention. Therefore I recommended he resume dymista daily and add Singulair 10 mg by mouth daily as well as Zyrtec 10 mg by mouth daily to that.

## 2014-04-05 HISTORY — PX: SHOULDER ARTHROSCOPY: SHX128

## 2014-05-23 ENCOUNTER — Ambulatory Visit (INDEPENDENT_AMBULATORY_CARE_PROVIDER_SITE_OTHER): Payer: BLUE CROSS/BLUE SHIELD | Admitting: Family Medicine

## 2014-05-23 ENCOUNTER — Encounter: Payer: Self-pay | Admitting: Family Medicine

## 2014-05-23 VITALS — BP 110/84 | HR 68 | Temp 98.3°F | Resp 16 | Ht 70.0 in | Wt 200.0 lb

## 2014-05-23 DIAGNOSIS — J111 Influenza due to unidentified influenza virus with other respiratory manifestations: Secondary | ICD-10-CM

## 2014-05-23 NOTE — Progress Notes (Signed)
   Subjective:    Patient ID: Wyatt Clark, male    DOB: 05/23/1975, 39 y.o.   MRN: [id number]  HPI 6 days ago, the patient was diagnosed with the flu by his physician at the allergy center. Symptoms began suddenly. Patient also had a fever to 102. He had diffuse myalgias and body aches. He reports a cough, sinus congestion, head congestion, and sore throat. He has taken with 5 days of Tamiflu and does not feel any better. Clinically he is afebrile. He is not acutely ill. He has no signs or symptoms of pneumonia. Past Medical History  Diagnosis Date  . Allergy    Past Surgical History  Procedure Laterality Date  . Hernia repair     Current Outpatient Prescriptions on File Prior to Visit  Medication Sig Dispense Refill  . montelukast (SINGULAIR) 10 MG tablet Take 1 tablet (10 mg total) by mouth at bedtime. 30 tablet 3   No current facility-administered medications on file prior to visit.   No Known Allergies History   Social History  . Marital Status: Married    Spouse Name: N/A    Number of Children: N/A  . Years of Education: N/A   Occupational History  . Not on file.   Social History Main Topics  . Smoking status: Never Smoker   . Smokeless tobacco: Never Used  . Alcohol Use: Yes  . Drug Use: No  . Sexual Activity: Not on file   Other Topics Concern  . Not on file   Social History Narrative      Review of Systems  All other systems reviewed and are negative.      Objective:   Physical Exam  Constitutional: He appears well-developed and well-nourished. No distress.  HENT:  Right Ear: External ear normal.  Left Ear: External ear normal.  Nose: Mucosal edema and rhinorrhea present.  Mouth/Throat: Oropharynx is clear and moist. No oropharyngeal exudate.  Eyes: Conjunctivae are normal.  Neck: Neck supple. No JVD present. No thyromegaly present.  Cardiovascular: Normal rate, regular rhythm and normal heart sounds.   No murmur heard. Pulmonary/Chest: Effort  normal and breath sounds normal. No respiratory distress. He has no wheezes. He has no rales.  Abdominal: Soft. Bowel sounds are normal.  Lymphadenopathy:    He has no cervical adenopathy.  Skin: He is not diaphoretic.  Vitals reviewed.         Assessment & Plan:  Flu syndrome  Symptoms are consistent with a flulike syndrome. I recommended tincture of time. Patient has been sick for 6 days. I explained the flu can typically last 10-12 days. I anticipate gradual resolution over the next week.

## 2014-06-12 ENCOUNTER — Ambulatory Visit (INDEPENDENT_AMBULATORY_CARE_PROVIDER_SITE_OTHER): Payer: BLUE CROSS/BLUE SHIELD | Admitting: Physician Assistant

## 2014-06-12 VITALS — BP 128/80 | HR 72 | Temp 98.0°F | Resp 17 | Ht 70.5 in | Wt 191.0 lb

## 2014-06-12 DIAGNOSIS — J329 Chronic sinusitis, unspecified: Secondary | ICD-10-CM

## 2014-06-12 MED ORDER — CEFDINIR 300 MG PO CAPS: 600.0000 mg | ORAL_CAPSULE | Freq: Every day | ORAL | Status: AC

## 2014-06-12 MED ORDER — IPRATROPIUM BROMIDE 0.03 % NA SOLN: 2.0000 | Freq: Two times a day (BID) | NASAL | Status: DC

## 2014-06-12 MED ORDER — GUAIFENESIN ER 1200 MG PO TB12: 1.0000 | ORAL_TABLET | Freq: Two times a day (BID) | ORAL | Status: DC | PRN

## 2014-06-12 MED ORDER — PREDNISONE 20 MG PO TABS: ORAL_TABLET | ORAL | Status: DC

## 2014-06-12 NOTE — Patient Instructions (Addendum)
Use the Dymista every day. It's much better at maintenance than treatment. Use a saline nasal spray and/or Vaseline inside the nostrils to reduce the risk of nosebleeds. The Mucinex will help thin the mucous so it's not so thick and sticky, making it easier to drain. Use the Atrovent nasal spray only until your symptoms are improved, then stop it. You may resume it if your symptoms begin to worsen again. Take the prednisone in the mornings and with a meal.

## 2014-06-12 NOTE — Progress Notes (Signed)
Subjective:    Patient ID: Wyatt Clark, male    DOB: 01/03/1976, 39 y.o.   MRN: [id number]   PCP: Wyatt GrosserPICKARD,Wyatt TOM, MD  Chief Complaint  Patient presents with  . Sinusitis    per patient     No Known Allergies  Patient Active Problem List   Diagnosis Date Noted  . Elevated blood pressure (not hypertension) 06/04/2013  . Allergic rhinitis     Prior to Admission medications   Medication Sig Start Date End Date Taking? Authorizing Provider  montelukast (SINGULAIR) 10 MG tablet Take 1 tablet (10 mg total) by mouth at bedtime. 03/03/14  Yes Donita BrooksWarren T Pickard, MD  DYMISTA [phone] MCG/ACT SUSP  05/13/14   Historical Provider, MD    Medical, Surgical, Family and Social History reviewed and updated.  HPI  Presents with sinusitis. Requests an antibiotic and steroid injection. He notes that this is a recurrent problem, and that he is now seeing an allergist. Last antibiotic therapy was 03/03/2014. Immunotherapy, almost at the maintenance dose. Inconsistent nasal steroid use due to nose bleeds and the perception that it makes his symptoms worse sometimes. Netty pot irrigation every night. Singulair. Previous sinus surgery "I don't think it helped anything." He saw his PCP 10/15, 9/15, 6/15 for same. He presented here 7/15 for same.  No fever, chills. No GI/GU symptoms. Some cough. Facial pressure and pain.  Review of Systems As above.    Objective:   Physical Exam  Constitutional: He is oriented to person, place, and time. Vital signs are normal. He appears well-developed and well-nourished. He is active and cooperative. No distress.  BP 128/80 mmHg  Pulse 72  Temp(Src) 98 F (36.7 C) (Oral)  Resp 17  Ht 5' 10.5" (1.791 m)  Wt 191 lb (86.637 kg)  BMI 27.01 kg/m2  SpO2 98%   HENT:  Head: Normocephalic and atraumatic.  Right Ear: Hearing, tympanic membrane, external ear and ear canal normal.  Left Ear: Hearing, tympanic membrane, external ear and ear canal normal.   Nose: Mucosal edema present.  No foreign bodies. Right sinus exhibits no maxillary sinus tenderness and no frontal sinus tenderness. Left sinus exhibits no maxillary sinus tenderness and no frontal sinus tenderness.  Mouth/Throat: Uvula is midline, oropharynx is clear and moist and mucous membranes are normal. No uvula swelling. No oropharyngeal exudate.  Eyes: Conjunctivae and EOM are normal. Pupils are equal, round, and reactive to light. Right eye exhibits no discharge. Left eye exhibits no discharge. No scleral icterus.  Neck: Trachea normal, normal range of motion and full passive range of motion without pain. Neck supple. No thyroid mass and no thyromegaly present.  Cardiovascular: Normal rate, regular rhythm and normal heart sounds.   Pulmonary/Chest: Effort normal and breath sounds normal.  Lymphadenopathy:       Head (right side): No submandibular, no tonsillar, no preauricular, no posterior auricular and no occipital adenopathy present.       Head (left side): No submandibular, no tonsillar, no preauricular and no occipital adenopathy present.    He has no cervical adenopathy.       Right: No supraclavicular adenopathy present.       Left: No supraclavicular adenopathy present.  Neurological: He is alert and oriented to person, place, and time. He has normal strength. No cranial nerve deficit or sensory deficit.  Skin: Skin is warm, dry and intact. No rash noted.  Psychiatric: He has a normal mood and affect. His speech is normal and behavior is normal.  Assessment & Plan:  1. Recurrent sinusitis Treat with cefdinir (last three episodes were treated with Augmentin). Prednisone taper. Counseled on the importance of aggressive maintenance therapy to reduce the need for antibiotics and systemic steroids. Encouraged the use of saline nasal spray and petroleum jelly to moisturize the nasal mucosa and reduce the risk of nose bleeds. Use Atrovent nasal spray PRN when very  congested. Add Mucinex to thin mucous.   Fernande Bras, PA-C Physician Assistant-Certified [facility]

## 2014-08-02 ENCOUNTER — Other Ambulatory Visit: Payer: Self-pay | Admitting: Family Medicine

## 2014-08-02 NOTE — Telephone Encounter (Signed)
Medication refilled per protocol. 

## 2014-10-05 ENCOUNTER — Telehealth: Payer: Self-pay | Admitting: Family Medicine

## 2014-10-05 NOTE — Telephone Encounter (Signed)
We have not seen pt since Jan - he will need an ov. Called and informed pt of this and tried to make an appt but he said it was hard for him to get away from work and will just go to an UC.

## 2014-10-05 NOTE — Telephone Encounter (Signed)
Patient calling because he has sinus infection again and wants to know if he need to come in or can something be call to pham [phone]

## 2015-03-07 ENCOUNTER — Encounter: Payer: Self-pay | Admitting: Family Medicine

## 2015-03-07 ENCOUNTER — Ambulatory Visit (INDEPENDENT_AMBULATORY_CARE_PROVIDER_SITE_OTHER): Payer: BLUE CROSS/BLUE SHIELD | Admitting: Family Medicine

## 2015-03-07 VITALS — BP 124/66 | HR 88 | Temp 99.0°F | Resp 14 | Ht 70.5 in | Wt 188.0 lb

## 2015-03-07 DIAGNOSIS — J019 Acute sinusitis, unspecified: Secondary | ICD-10-CM | POA: Diagnosis not present

## 2015-03-07 DIAGNOSIS — G4700 Insomnia, unspecified: Secondary | ICD-10-CM

## 2015-03-07 DIAGNOSIS — B9689 Other specified bacterial agents as the cause of diseases classified elsewhere: Secondary | ICD-10-CM

## 2015-03-07 MED ORDER — PREDNISONE 20 MG PO TABS: ORAL_TABLET | ORAL | Status: DC

## 2015-03-07 MED ORDER — TRAZODONE HCL 50 MG PO TABS: ORAL_TABLET | ORAL | Status: AC

## 2015-03-07 MED ORDER — CEFDINIR 300 MG PO CAPS: 300.0000 mg | ORAL_CAPSULE | Freq: Two times a day (BID) | ORAL | Status: DC

## 2015-03-07 NOTE — Progress Notes (Signed)
Patient ID: Isaias SakaiLEONARD Syler, male   DOB: 07/15/1975, 39 y.o.   MRN: [id number]   Subjective:    Patient ID: Isaias SakaiLEONARD Sarinana, male    DOB: 02/22/1976, 39 y.o.   MRN: [id number]  Patient presents for Illness pt  here with sinus pressure congestion, drainage and headache low-grade fever for the past 2 weeks. These are his typical symptoms for his chronic sinusitis. He has had sinus surgery. Despite the surgery as well as follow-up with allergist she continues to have sinus infections. He is working full-time right now does not have time to go back to ENT he is also going through a divorce. He was last treated for sinus infection back in February of this year.  At the end of the visit he tells me he was going through a divorce seems have some difficulty sleeping and has been stressed during the day but he is still doing fairly well and he is now single parent taking care of his children    Review Of Systems:  GEN- denies fatigue, fever, weight loss,weakness, recent illness HEENT- denies eye drainage, change in vision,+ nasal discharge, CVS- denies chest pain, palpitations RESP- denies SOB, cough, wheeze ABD- denies N/V, change in stools, abd pain Neuro- denies headache, dizziness, syncope, seizure activity       Objective:    BP 124/66 mmHg  Pulse 88  Temp(Src) 99 F (37.2 C) (Oral)  Resp 14  Ht 5' 10.5" (1.791 m)  Wt 188 lb (85.276 kg)  BMI 26.58 kg/m2 GEN- NAD, alert and oriented x3 HEENT- PERRL, EOMI, non injected sclera, pink conjunctiva, MMM, oropharynx mild injection, TM clear bilat no effusion,  + maxillary sinus tenderness, inflammed turbinates,  Nasal drainage  Neck- Supple, + submandibular LAD CVS- RRR, no murmur RESP-CTAB Psych- normal affect and mood, well groomed, good eye contact Pulses- Radial 2+         Assessment & Plan:      Problem List Items Addressed This Visit    None    Visit Diagnoses    Acute bacterial rhinosinusitis    -  Primary    Chronic  recurrent problem, I recommend a CT max/face , he will be free to do this around Jan with his busy schedule. Omnicef, prednisone taper sent    Relevant Medications    cefdinir (OMNICEF) 300 MG capsule    predniSONE (DELTASONE) 20 MG tablet    Insomnia        Trial of trazodone, situational stress, insomnia, going through a divorce,       Note: This dictation was prepared with Dragon dictation along with smaller phrase technology. Any transcriptional errors that result from this process are unintentional.

## 2015-04-10 ENCOUNTER — Ambulatory Visit (INDEPENDENT_AMBULATORY_CARE_PROVIDER_SITE_OTHER): Payer: BLUE CROSS/BLUE SHIELD | Admitting: Physician Assistant

## 2015-04-10 ENCOUNTER — Encounter: Payer: Self-pay | Admitting: Physician Assistant

## 2015-04-10 VITALS — BP 120/80 | HR 80 | Temp 97.4°F | Resp 18 | Wt 198.0 lb

## 2015-04-10 DIAGNOSIS — J019 Acute sinusitis, unspecified: Secondary | ICD-10-CM | POA: Diagnosis not present

## 2015-04-10 MED ORDER — PREDNISONE 20 MG PO TABS: ORAL_TABLET | ORAL | Status: DC

## 2015-04-10 MED ORDER — AMOXICILLIN-POT CLAVULANATE 875-125 MG PO TABS: 1.0000 | ORAL_TABLET | Freq: Two times a day (BID) | ORAL | Status: DC

## 2015-04-10 NOTE — Progress Notes (Signed)
    Patient ID: Wyatt SakaiLEONARD Lagerquist MRN: [id number], DOB: 08/30/1975, 39 y.o. Date of Encounter: 04/10/2015, 12:25 PM    Chief Complaint:  Chief Complaint  Patient presents with  . c/o sinus infection    bad headache, yellow secretions     HPI: 39 y.o. year old male presents with above symptoms. Says that he is having a lot of sinus pain and pressure. Getting thick dark mucus from his nose. So far no chest congestion. Had no fevers or chills and no significant sore throat.      Home Meds:   Outpatient Prescriptions Prior to Visit  Medication Sig Dispense Refill  . traZODone (DESYREL) 50 MG tablet Take 1 tablet at bedtime as needed for insomnia 30 tablet 6  . cefdinir (OMNICEF) 300 MG capsule Take 1 capsule (300 mg total) by mouth 2 (two) times daily. 20 capsule 0  . predniSONE (DELTASONE) 20 MG tablet Take 3 PO QAM x3days, 2 PO QAM x3days, 1 PO QAM x3days 18 tablet 0   No facility-administered medications prior to visit.    Allergies: No Known Allergies    Review of Systems: See HPI for pertinent ROS. All other ROS negative.    Physical Exam: Blood pressure 120/80, pulse 80, temperature 97.4 F (36.3 C), temperature source Oral, resp. rate 18, weight 198 lb (89.812 kg)., Body mass index is 28 kg/(m^2). General:  WNWD WM. Appears in no acute distress. HEENT: Normocephalic, atraumatic, eyes without discharge, sclera non-icteric, nares are without discharge. Bilateral auditory canals clear, TM's are without perforation, pearly grey and translucent with reflective cone of light bilaterally. Oral cavity moist, posterior pharynx without exudate, erythema, peritonsillar abscess. Positive tenderness with percussion to maxillary sinuses but minimal tenderness with percussion of frontal sinuses. Neck: Supple. No thyromegaly. No lymphadenopathy. Lungs: Clear bilaterally to auscultation without wheezes, rales, or rhonchi. Breathing is unlabored. Heart: Regular rhythm. No murmurs, rubs, or  gallops. Msk:  Strength and tone normal for age. Extremities/Skin: Warm and dry. Neuro: Alert and oriented X 3. Moves all extremities spontaneously. Gait is normal. CNII-XII grossly in tact. Psych:  Responds to questions appropriately with a normal affect.     ASSESSMENT AND PLAN:  39 y.o. year old male with  1. Acute sinusitis, recurrence not specified, unspecified location He is to take antibiotic as directed and complete all of it. Also take the prednisone taper as directed. follow up if symptoms do not resolve after completion of these. - amoxicillin-clavulanate (AUGMENTIN) 875-125 MG tablet; Take 1 tablet by mouth 2 (two) times daily.  Dispense: 20 tablet; Refill: 0 - predniSONE (DELTASONE) 20 MG tablet; Take 3 daily for 2 days, then 2 daily for 2 days, then 1 daily for 2 days.  Dispense: 12 tablet; Refill: 0   Signed, [address] Beth MitchellDixon, [place], [facility] 04/10/2015 12:25 PM

## 2015-06-01 ENCOUNTER — Encounter: Payer: Self-pay | Admitting: Physician Assistant

## 2015-06-01 ENCOUNTER — Ambulatory Visit (INDEPENDENT_AMBULATORY_CARE_PROVIDER_SITE_OTHER): Payer: BLUE CROSS/BLUE SHIELD | Admitting: Physician Assistant

## 2015-06-01 VITALS — BP 126/88 | HR 80 | Temp 98.3°F | Resp 18 | Wt 198.0 lb

## 2015-06-01 DIAGNOSIS — J0191 Acute recurrent sinusitis, unspecified: Secondary | ICD-10-CM | POA: Diagnosis not present

## 2015-06-01 MED ORDER — MONTELUKAST SODIUM 10 MG PO TABS: 10.0000 mg | ORAL_TABLET | Freq: Every day | ORAL | Status: DC

## 2015-06-01 MED ORDER — PREDNISONE 20 MG PO TABS: ORAL_TABLET | ORAL | Status: DC

## 2015-06-01 MED ORDER — CETIRIZINE HCL 10 MG PO TABS: 10.0000 mg | ORAL_TABLET | Freq: Every day | ORAL | Status: DC

## 2015-06-01 NOTE — Progress Notes (Signed)
Patient ID: Wyatt Clark MRN: [id number], DOB: [birth year], 40 y.o. Date of Encounter: 06/01/2015, 12:38 PM    Chief Complaint:  Chief Complaint  Patient presents with  . sick x 1 week    sinus infection     HPI: 40 y.o. year old white male says that for the past week he has had no energy his throat feels irritated and he feels puffy under his eyes and tight and congested in his nose. Says that this is a recurrent problem. He is seeing an allergist in the past. Runs a landscaping business. Is getting no thick dark mucus from the nose. No chest congestion. No fevers or chills.     Home Meds:   Outpatient Prescriptions Prior to Visit  Medication Sig Dispense Refill  . traZODone (DESYREL) 50 MG tablet Take 1 tablet at bedtime as needed for insomnia 30 tablet 6  . amoxicillin-clavulanate (AUGMENTIN) 875-125 MG tablet Take 1 tablet by mouth 2 (two) times daily. 20 tablet 0  . predniSONE (DELTASONE) 20 MG tablet Take 3 daily for 2 days, then 2 daily for 2 days, then 1 daily for 2 days. 12 tablet 0   No facility-administered medications prior to visit.    Allergies: No Known Allergies    Review of Systems: See HPI for pertinent ROS. All other ROS negative.    Physical Exam: Blood pressure 126/88, pulse 80, temperature 98.3 F (36.8 C), temperature source Oral, resp. rate 18, weight 198 lb (89.812 kg)., Body mass index is 28 kg/(m^2). General:  WNWD WM. Appears in no acute distress. HEENT: Normocephalic, atraumatic, eyes without discharge, sclera non-icteric, nares are without discharge. Bilateral auditory canals clear, TM's are without perforation, pearly grey and translucent with reflective cone of light bilaterally. Oral cavity moist, posterior pharynx without exudate, erythema, peritonsillar abscess. He has tenderness with percussion to the maxillary sinuses bilaterally.  Neck: Supple. No thyromegaly. No lymphadenopathy. Lungs: Clear bilaterally to auscultation without wheezes,  rales, or rhonchi. Breathing is unlabored. Heart: Regular rhythm. No murmurs, rubs, or gallops. Msk:  Strength and tone normal for age. Extremities/Skin: Warm and dry.  Neuro: Alert and oriented X 3. Moves all extremities spontaneously. Gait is normal. CNII-XII grossly in tact. Psych:  Responds to questions appropriately with a normal affect.     ASSESSMENT AND PLAN:  40 y.o. year old male with  1. Acute recurrent sinusitis, unspecified location To get rid of this acute flare he is to take the prednisone taper. To keep this controlled and prevent flares he is to take Zyrtec and Singulair daily. He already has nasal spray with Astelin and Flonase. If he starts developing thick dark yellow-green mucus he is to call and I will add antibiotic. Discussed possibly following up with the allergist if flares continue. Discussed possibly seeing ENT to see what type of procedures may be available. He has had sinus surgery in the past and that did not help.  Says he has recently heard advertisements for some type of balloon procedure to the sinuses . - predniSONE (DELTASONE) 20 MG tablet; Take 3 daily for 2 days, then 2 daily for 2 days, then 1 daily for 2 days.  Dispense: 12 tablet; Refill: 0 - cetirizine (ZYRTEC) 10 MG tablet; Take 1 tablet (10 mg total) by mouth daily.  Dispense: 30 tablet; Refill: 11 - montelukast (SINGULAIR) 10 MG tablet; Take 1 tablet (10 mg total) by mouth at bedtime.  Dispense: 30 tablet; Refill: 3   Signed, [address] [place], [place], [place]  06/01/2015 12:38 PM

## 2015-07-10 ENCOUNTER — Telehealth: Payer: Self-pay | Admitting: Family Medicine

## 2015-07-10 DIAGNOSIS — J329 Chronic sinusitis, unspecified: Secondary | ICD-10-CM

## 2015-07-10 NOTE — Telephone Encounter (Signed)
Pt agreeable to CT scan, order placed.

## 2015-07-10 NOTE — Telephone Encounter (Signed)
Patient calling requesting MB to give him another prescription predniSONE (DELTASONE) 20 MG  CB#[phone]

## 2015-07-10 NOTE — Telephone Encounter (Signed)
He has been treated with Prednisone a lot recently---At [facility] 03/07/2015, 04/10/2015, 06/01/2015.  Recommend CT Sinuses.  See if pt agreeable -- If so, place order.

## 2015-07-10 NOTE — Telephone Encounter (Signed)
Sinus infection is back, states chronic, request refill Prednisone.  Please advise?

## 2015-07-13 ENCOUNTER — Ambulatory Visit (INDEPENDENT_AMBULATORY_CARE_PROVIDER_SITE_OTHER): Payer: BLUE CROSS/BLUE SHIELD | Admitting: Family Medicine

## 2015-07-13 ENCOUNTER — Encounter: Payer: Self-pay | Admitting: Family Medicine

## 2015-07-13 VITALS — BP 132/84 | HR 84 | Temp 98.0°F | Resp 16 | Ht 69.5 in | Wt 199.0 lb

## 2015-07-13 DIAGNOSIS — J320 Chronic maxillary sinusitis: Secondary | ICD-10-CM | POA: Diagnosis not present

## 2015-07-13 MED ORDER — CEFDINIR 300 MG PO CAPS: 300.0000 mg | ORAL_CAPSULE | Freq: Two times a day (BID) | ORAL | Status: AC

## 2015-07-13 MED ORDER — AZELASTINE HCL 0.1 % NA SOLN: 2.0000 | Freq: Two times a day (BID) | NASAL | Status: AC

## 2015-07-13 MED ORDER — PREDNISONE 20 MG PO TABS: ORAL_TABLET | ORAL | Status: DC

## 2015-07-13 MED ORDER — AZELASTINE-FLUTICASONE 137-50 MCG/ACT NA SUSP: 2.0000 | Freq: Every day | NASAL | Status: AC

## 2015-07-13 MED ORDER — FLUTICASONE PROPIONATE 50 MCG/ACT NA SUSP: 2.0000 | Freq: Every day | NASAL | Status: AC

## 2015-07-13 NOTE — Progress Notes (Signed)
Subjective:    Patient ID: Wyatt Clark, male    DOB: 05/23/1975, [age] y.o.   MRN: [id number]  HPI  he has been seen 3 times in the last 4 months for sinus infections. He presents today with one-week of pain and pressure in his maxillary sinuses bilaterally. He also has subjective swollen lymph nodes in his neck although I cannot appreciate them today on exam. He complains of rhinorrhea and head congestion and postnasal drip and severe fatigue. He is not taking dymista but is compliant with Zyrtec and Singulair  Past Medical History  Diagnosis Date  . Allergy     recurrent sinusitis, immunotherapy   Past Surgical History  Procedure Laterality Date  . Hernia repair Right   . Shoulder arthroscopy Left 04/2014  . Nasal sinus surgery     Current Outpatient Prescriptions on File Prior to Visit  Medication Sig Dispense Refill  . traZODone (DESYREL) 50 MG tablet Take 1 tablet at bedtime as needed for insomnia 30 tablet 6  . Azelastine-Fluticasone (DYMISTA) 137-50 MCG/ACT SUSP Place into the nose. Reported on 07/13/2015    . cetirizine (ZYRTEC) 10 MG tablet Take 1 tablet (10 mg total) by mouth daily. (Patient not taking: Reported on 07/13/2015) 30 tablet 11  . montelukast (SINGULAIR) 10 MG tablet Take 1 tablet (10 mg total) by mouth at bedtime. (Patient not taking: Reported on 07/13/2015) 30 tablet 3   No current facility-administered medications on file prior to visit.   No Known Allergies Social History   Social History  . Marital Status: Married    Spouse Name: Tamera PuntMiranda  . Number of Children: 1  . Years of Education: 14   Occupational History  . CytogeneticistLandscape Management     Company Owner   Social History Main Topics  . Smoking status: Never Smoker   . Smokeless tobacco: Never Used  . Alcohol Use: 0.0 - 3.0 oz/week    0-5 Standard drinks or equivalent per week  . Drug Use: No  . Sexual Activity: Not on file   Other Topics Concern  . Not on file   Social History Narrative   Divorced.    One son.   Runs a Aeronautical engineerlandscaping business.      Review of Systems  All other systems reviewed and are negative.      Objective:   Physical Exam  Constitutional: He appears well-developed and well-nourished. No distress.  HENT:  Right Ear: External ear normal.  Left Ear: External ear normal.  Nose: Mucosal edema and rhinorrhea present.  Mouth/Throat: Oropharynx is clear and moist. No oropharyngeal exudate.  Eyes: Conjunctivae are normal.  Neck: Neck supple. No JVD present. No thyromegaly present.  Cardiovascular: Normal rate, regular rhythm and normal heart sounds.   No murmur heard. Pulmonary/Chest: Effort normal and breath sounds normal. No respiratory distress. He has no wheezes. He has no rales.  Abdominal: Soft. Bowel sounds are normal.  Lymphadenopathy:    He has no cervical adenopathy.  Skin: He is not diaphoretic.  Vitals reviewed.     A/P Chronic maxillary sinusitis - Plan: predniSONE (DELTASONE) 20 MG tablet, cefdinir (OMNICEF) 300 MG capsule  I believe the patient has chronic sinusitis likely secondary to allergies. I do not see a role yet for antibiotics. I will give the patient prednisone taper pack to try to calm down the sinus inflammation secondary to allergies. I then begged the patient to be compliant with Dymista to help prevent this in the future. If symptoms do not improve  I would proceed with a CT scan of the sinuses.  I gave the patient prescription for Omnicef in case he develops a fever or severe pain but I asked the patient not to get the prescription yet as I do not believe he would benefit from an antibiotic at this time.

## 2015-07-13 NOTE — Addendum Note (Signed)
Addended by: Legrand RamsWILLIS, SANDY B on: 07/13/2015 04:22 PM   Modules accepted: Orders

## 2015-07-14 ENCOUNTER — Other Ambulatory Visit: Payer: Self-pay

## 2015-07-27 ENCOUNTER — Ambulatory Visit (INDEPENDENT_AMBULATORY_CARE_PROVIDER_SITE_OTHER): Payer: BLUE CROSS/BLUE SHIELD | Admitting: Family Medicine

## 2015-07-27 ENCOUNTER — Encounter: Payer: Self-pay | Admitting: Family Medicine

## 2015-07-27 VITALS — BP 146/84 | HR 86 | Temp 97.8°F | Resp 16 | Wt 197.0 lb

## 2015-07-27 DIAGNOSIS — J208 Acute bronchitis due to other specified organisms: Secondary | ICD-10-CM

## 2015-07-27 MED ORDER — ALBUTEROL SULFATE HFA 108 (90 BASE) MCG/ACT IN AERS: 2.0000 | INHALATION_SPRAY | Freq: Four times a day (QID) | RESPIRATORY_TRACT | Status: AC | PRN

## 2015-07-27 MED ORDER — HYDROCODONE-HOMATROPINE 5-1.5 MG/5ML PO SYRP: 5.0000 mL | ORAL_SOLUTION | Freq: Three times a day (TID) | ORAL | Status: AC | PRN

## 2015-07-27 NOTE — Progress Notes (Signed)
Subjective:    Patient ID: Wyatt Clark, male    DOB: 08/12/1975, 40 y.o.   MRN: [id number]  HPI  On March 11, the patient was diagnosed with the flu at an urgent care. Ever since that time he has had a nonproductive cough, chest congestion. He reports dizziness and fatigue. He denies any fevers. He denies any chills. He denies any shortness of breath. He denies any hemoptysis. He denies any purulent sputum. He denies any sore throat. He does report coughing spells that occur frequently and lasts several minutes. This keeps him from sleeping at night. He also reports chest congestion that he is unable to break up or cough out even though he is taking Mucinex. On examination today he is nontoxic non-ill appearing. He does have faint expiratory wheezes and some rhonchorous breath sounds but otherwise his exam is completely normal. Past Medical History  Diagnosis Date  . Allergy     recurrent sinusitis, immunotherapy   Past Surgical History  Procedure Laterality Date  . Hernia repair Right   . Shoulder arthroscopy Left 04/2014  . Nasal sinus surgery     Current Outpatient Prescriptions on File Prior to Visit  Medication Sig Dispense Refill  . azelastine (ASTELIN) 0.1 % nasal spray Place 2 sprays into both nostrils 2 (two) times daily. Use in each nostril as directed 30 mL 12  . Azelastine-Fluticasone (DYMISTA) 137-50 MCG/ACT SUSP Place 2 sprays into the nose daily. Reported on 07/13/2015 1 Bottle 11  . cefdinir (OMNICEF) 300 MG capsule Take 1 capsule (300 mg total) by mouth 2 (two) times daily. 20 capsule 0  . cetirizine (ZYRTEC) 10 MG tablet Take 1 tablet (10 mg total) by mouth daily. 30 tablet 11  . fluticasone (FLONASE) 50 MCG/ACT nasal spray Place 2 sprays into both nostrils daily. 16 g 6  . montelukast (SINGULAIR) 10 MG tablet Take 1 tablet (10 mg total) by mouth at bedtime. 30 tablet 3  . predniSONE (DELTASONE) 20 MG tablet 3 tabs poqday 1-2, 2 tabs poqday 3-4, 1 tab poqday 5-6 12 tablet  0  . traZODone (DESYREL) 50 MG tablet Take 1 tablet at bedtime as needed for insomnia 30 tablet 6   No current facility-administered medications on file prior to visit.   No Known Allergies Social History   Social History  . Marital Status: Married    Spouse Name: Tamera PuntMiranda  . Number of Children: 1  . Years of Education: 14   Occupational History  . CytogeneticistLandscape Management     Company Owner   Social History Main Topics  . Smoking status: Never Smoker   . Smokeless tobacco: Never Used  . Alcohol Use: 0.0 - 3.0 oz/week    0-5 Standard drinks or equivalent per week  . Drug Use: No  . Sexual Activity: Not on file   Other Topics Concern  . Not on file   Social History Narrative   Divorced.    One son.   Runs a Aeronautical engineerlandscaping business.     Review of Systems  All other systems reviewed and are negative.      Objective:   Physical Exam  Constitutional: He appears well-developed and well-nourished.  HENT:  Nose: Nose normal.  Mouth/Throat: Oropharynx is clear and moist. No oropharyngeal exudate.  Eyes: Conjunctivae are normal.  Neck: Neck supple.  Cardiovascular: Normal rate, regular rhythm and normal heart sounds.   Pulmonary/Chest: Effort normal. He has wheezes.  Lymphadenopathy:    He has no cervical adenopathy.  Vitals  reviewed.         Assessment & Plan:  Acute bronchitis due to other specified organisms - Plan: albuterol (PROVENTIL HFA;VENTOLIN HFA) 108 (90 Base) MCG/ACT inhaler, HYDROcodone-homatropine (HYCODAN) 5-1.5 MG/5ML syrup  I believe the patient has developed bronchitis due to influenza. I still believe that this is a viral infection. I explained to the patient that antibiotics do not treat a virus. Unfortunately this will have to run its course. Because of his wheezing I did recommend albuterol 2 puffs inhaled every 6 hours. Continue take Mucinex as a mucolytic to help break up the chest congestion. He can use Hycodan 1 teaspoon every 6 hours as needed  for cough. The patient at the end of the encounter asked me if it was okay to go back to the gym and I told him to wait until his symptoms are completely improved before he went back to the gym and started working out. I suspect that he should be better over the next 3-4 days.

## 2015-07-31 ENCOUNTER — Ambulatory Visit: Payer: BLUE CROSS/BLUE SHIELD | Admitting: Family Medicine

## 2015-10-07 ENCOUNTER — Other Ambulatory Visit: Payer: Self-pay | Admitting: Physician Assistant

## 2016-03-07 ENCOUNTER — Ambulatory Visit: Payer: BLUE CROSS/BLUE SHIELD | Admitting: Family Medicine

## 2016-03-14 ENCOUNTER — Other Ambulatory Visit: Payer: Self-pay | Admitting: Physician Assistant

## 2016-03-14 NOTE — Telephone Encounter (Signed)
Medication refilled per protocol. 

## 2016-06-24 ENCOUNTER — Other Ambulatory Visit: Payer: Self-pay | Admitting: Physician Assistant

## 2016-06-24 DIAGNOSIS — J0191 Acute recurrent sinusitis, unspecified: Secondary | ICD-10-CM

## 2016-06-24 NOTE — Telephone Encounter (Signed)
Rx filled per protocol  

## 2016-06-30 ENCOUNTER — Emergency Department (HOSPITAL_COMMUNITY)
Admission: EM | Admit: 2016-06-30 | Discharge: 2016-07-01 | Disposition: A | Discharge status: Home / Self Care | Payer: BLUE CROSS/BLUE SHIELD | Attending: Emergency Medicine | Admitting: Emergency Medicine

## 2016-06-30 ENCOUNTER — Encounter (HOSPITAL_COMMUNITY): Payer: Self-pay

## 2016-06-30 ENCOUNTER — Emergency Department (HOSPITAL_COMMUNITY): Payer: BLUE CROSS/BLUE SHIELD

## 2016-06-30 DIAGNOSIS — Z79899 Other long term (current) drug therapy: Secondary | ICD-10-CM | POA: Diagnosis not present

## 2016-06-30 DIAGNOSIS — R112 Nausea with vomiting, unspecified: Secondary | ICD-10-CM | POA: Diagnosis present

## 2016-06-30 DIAGNOSIS — K529 Noninfective gastroenteritis and colitis, unspecified: Secondary | ICD-10-CM | POA: Diagnosis not present

## 2016-06-30 MED ORDER — SODIUM CHLORIDE 0.9 % IV BOLUS (SEPSIS)
1000.0000 mL | Freq: Once | INTRAVENOUS | Status: AC
  Administered 2016-06-30: 1000 mL via INTRAVENOUS

## 2016-06-30 MED ORDER — ONDANSETRON HCL 4 MG/2ML IJ SOLN
4.0000 mg | Freq: Once | INTRAMUSCULAR | Status: AC
  Administered 2016-06-30: 4 mg via INTRAVENOUS
  Filled 2016-06-30: qty 2

## 2016-06-30 MED ORDER — HYOSCYAMINE SULFATE 0.5 MG/ML IJ SOLN
0.1250 mg | Freq: Once | INTRAMUSCULAR | Status: AC
  Administered 2016-06-30: 0.125 mg via INTRAVENOUS
  Filled 2016-06-30: qty 0.25

## 2016-06-30 NOTE — ED Notes (Signed)
ED Provider at bedside. 

## 2016-06-30 NOTE — ED Triage Notes (Signed)
Right shoulder surgery on Thursday and increasing pain and called on call doctor and states he could be constipated and given medication [facility] now has nausea and vomiting and fever 100.0 oral at home.

## 2016-06-30 NOTE — ED Provider Notes (Signed)
[facility]-EMERGENCY DEPT Provider Note   CSN: [id number] Arrival date & time: 06/30/16  2141  By signing my name below, I, Majel Homer, attest that this documentation has been prepared under the direction and in the presence of non-physician practitioner, Elpidio Anis, PA-C. Electronically Signed: Majel Homer, Scribe. 06/30/2016. 11:34 PM.  History   Chief Complaint Chief Complaint  Patient presents with  . Shoulder Pain  . Abdominal Pain   The history is provided by the patient and a friend. No language interpreter was used.   HPI Comments: Wyatt Clark is a 41 y.o. male who presents to the Emergency Department complaining of gradually worsening, "sharp," abdominal pain that began yesterday. Pt reports associated nausea, multiple episodes of vomiting, and constipaton. Per girlfriend, pt's last episode of vomiting was at ~8:30PM this evening and his last bowel movement was at ~9:00 PM. She describes his bowel movements as "very small with a little liquid in it" and states he has been unable to pass any gas. She reports her aunt that is a nurse visited the pt yesterday and inserted a Doculax suppository with no relief of pain. Pt denies any dysuria or difficulty urinating.   Pt also complains of gradually worsening, left shoulder pain s/p surgery to his left shoulder 3 days ago. Pt's girlfriend reports pt was given a prescription for oxycodone but believes pt "took too many" and had an adverse reaction. She states pt has been taking Advil and Tylenol instead with mild relief.   Past Medical History:  Diagnosis Date  . Allergy    recurrent sinusitis, immunotherapy   Patient Active Problem List   Diagnosis Date Noted  . Elevated blood pressure (not hypertension) 06/04/2013  . Allergic rhinitis    Past Surgical History:  Procedure Laterality Date  . HERNIA REPAIR Right   . NASAL SINUS SURGERY    . SHOULDER ARTHROSCOPY Left 04/2014    Home Medications    Prior to Admission medications     Medication Sig Start Date End Date Taking? Authorizing Provider  acetaminophen (TYLENOL) 500 MG tablet Take 1,000 mg by mouth every 6 (six) hours as needed for mild pain or moderate pain.   Yes Historical Provider, MD  bisacodyl (DULCOLAX) 10 MG suppository Place 10 mg rectally as needed for moderate constipation or severe constipation.   Yes Historical Provider, MD  ibuprofen (ADVIL,MOTRIN) 200 MG tablet Take 400 mg by mouth every 6 (six) hours as needed for headache or moderate pain.   Yes Historical Provider, MD  magnesium hydroxide (MILK OF MAGNESIA) 400 MG/5ML suspension Take 45 mLs by mouth daily as needed for mild constipation or moderate constipation.   Yes Historical Provider, MD  oxyCODONE (OXY IR/ROXICODONE) 5 MG immediate release tablet Take 5 mg by mouth every 4 (four) hours as needed for moderate pain or severe pain.  03/29/14  Yes Historical Provider, MD  promethazine (PHENERGAN) 25 MG suppository Place 25 mg rectally every 6 (six) hours as needed for nausea or vomiting.   Yes Historical Provider, MD  albuterol (PROVENTIL HFA;VENTOLIN HFA) 108 (90 Base) MCG/ACT inhaler Inhale 2 puffs into the lungs every 6 (six) hours as needed for wheezing or shortness of breath. Patient not taking: Reported on 06/30/2016 07/27/15   Donita Brooks, MD  azelastine (ASTELIN) 0.1 % nasal spray Place 2 sprays into both nostrils 2 (two) times daily. Use in each nostril as directed Patient not taking: Reported on 06/30/2016 07/13/15   Donita Brooks, MD  Azelastine-Fluticasone [facility]) 137-50 MCG/ACT SUSP  Place 2 sprays into the nose daily. Reported on 07/13/2015 Patient not taking: Reported on 06/30/2016 07/13/15   Donita BrooksWarren T Pickard, MD  cefdinir (OMNICEF) 300 MG capsule Take 1 capsule (300 mg total) by mouth 2 (two) times daily. Patient not taking: Reported on 06/30/2016 07/13/15   Donita BrooksWarren T Pickard, MD  cetirizine (ZYRTEC) 10 MG tablet TAKE 1 TABLET BY MOUTH EVERY DAY Patient not taking: Reported on 06/30/2016  06/24/16   Dorena BodoMary B Dixon, PA-C  fluticasone [facility](FLONASE) 50 MCG/ACT nasal spray Place 2 sprays into both nostrils daily. Patient not taking: Reported on 06/30/2016 07/13/15   Donita BrooksWarren T Pickard, MD  HYDROcodone-homatropine [facility](HYCODAN) 5-1.5 MG/5ML syrup Take 5 mLs by mouth every 8 (eight) hours as needed for cough. Patient not taking: Reported on 06/30/2016 07/27/15   Donita BrooksWarren T Pickard, MD  montelukast (SINGULAIR) 10 MG tablet TAKE 1 TABLET BY MOUTH AT BEDTIME Patient not taking: Reported on 06/30/2016 03/14/16   Dorena BodoMary B Dixon, PA-C  traZODone (DESYREL) 50 MG tablet Take 1 tablet at bedtime as needed for insomnia Patient not taking: Reported on 06/30/2016 03/07/15   Salley ScarletKawanta F La Paloma, MD    Family History Family History  Problem Relation Age of Onset  . Hypertension Father   . Mental illness Father     anxiety  . Diabetes Mother   . Cancer Sister [id number]    breast    Social History Social History  Substance Use Topics  . Smoking status: Never Smoker  . Smokeless tobacco: Never Used  . Alcohol use 0.0 - 3.0 oz/week   Allergies   Patient has no known allergies.  Review of Systems Review of Systems  Constitutional: Negative for fever.  Respiratory: Negative for shortness of breath.   Cardiovascular: Negative for chest pain.  Gastrointestinal: Positive for abdominal pain, constipation, nausea and vomiting.  Genitourinary: Negative for difficulty urinating and dysuria.  Musculoskeletal: Positive for arthralgias (See HPI.).  Skin: Negative for color change and wound.  Neurological: Negative for syncope, weakness and headaches.   Physical Exam Updated Vital Signs BP 135/90 (BP Location: Left Arm)   Pulse 71   Temp 97.5 F (36.4 C) (Oral)   Resp 18   Ht 5\' 10"  (1.778 m)   Wt 178 lb (80.7 kg)   SpO2 98%   BMI 25.54 kg/m   Physical Exam  Constitutional: He is oriented to person, place, and time. He appears well-developed and well-nourished.  HENT:  Head: Normocephalic and atraumatic.  Eyes:  EOM are normal.  Neck: Normal range of motion.  Cardiovascular: Normal rate, regular rhythm, normal heart sounds and intact distal pulses.   Pulmonary/Chest: Effort normal and breath sounds normal. No respiratory distress.  Abdominal: Soft. Bowel sounds are normal. He exhibits no distension. There is tenderness (Diffuse abdominal tenderness. ). There is no rebound and no guarding.  Musculoskeletal: Normal range of motion.  Neurological: He is alert and oriented to person, place, and time.  Skin: Skin is warm and dry.  Psychiatric: He has a normal mood and affect. Judgment normal.  Nursing note and vitals reviewed.  ED Treatments / Results  DIAGNOSTIC STUDIES:  Oxygen Saturation is 98% on RA, normal by my interpretation.    COORDINATION OF CARE:  11:22 PM Discussed treatment plan with pt at bedside and pt agreed to plan.  Labs (all labs ordered are listed, but only abnormal results are displayed) Labs Reviewed - No data to display  EKG  EKG Interpretation None       Radiology Dg  Abd Acute W/chest  Result Date: 07/01/2016 CLINICAL DATA:  Abdominal pain and vomiting. EXAM: DG ABDOMEN ACUTE W/ 1V CHEST COMPARISON:  None. FINDINGS: The cardiomediastinal contours are normal. The lungs are clear. There is no free intra-abdominal air. No dilated bowel loops to suggest obstruction. A few scattered air-fluid levels are noted. Small volume of stool throughout the colon. No radiopaque calculi. Tacks in the right lower abdomen from prior hernia repair. No acute osseous abnormalities are seen. IMPRESSION: No bowel obstruction or free air. A few scattered air-fluid levels are nonspecific, can be seen with enteritis or bowel inflammation. No acute pulmonary process. Electronically Signed   By: Rubye Oaks M.D.   On: 07/01/2016 00:18   Procedures Procedures (including critical care time)  Medications Ordered in ED Medications - No data to display  Initial Impression / Assessment and  Plan / ED Course  I have reviewed the triage vital signs and the nursing notes.  Pertinent labs & imaging results that were available during my care of the patient were reviewed by me and considered in my medical decision making (see chart for details).     Patient presents with abdominal pain, nausea, vomiting, bowel movement changes x 2 days. He had shoulder surgery 3 days ago and she feels he may have taken too many oxycodone on day that symptoms started. He has taken tylenol and/or ibuprofen since that time. Wife reports fever at home.   Symptoms are controlled here with IVF's, Zofran and levsin. He is tolerating PO fluids without further vomiting. He is given 2L IVF's and reports feeling some better. Plain film shows no obstruction, and findings c/w enteritis. Will treat symptomatically with return precautions discussed.   I personally performed the services described in this documentation, which was scribed in my presence. The recorded information has been reviewed and is accurate.    Final Clinical Impressions(s) / ED Diagnoses   Final diagnoses:  None   1. Enteritis  New Prescriptions New Prescriptions   No medications on file     Elpidio Anis, PA-C 07/01/16 0246    Derwood Kaplan, MD 07/01/16 [phone]

## 2016-07-01 LAB — URINALYSIS, ROUTINE W REFLEX MICROSCOPIC
Bilirubin Urine: NEGATIVE
Glucose, UA: NEGATIVE mg/dL
Hgb urine dipstick: NEGATIVE
Ketones, ur: 5 mg/dL — AB
LEUKOCYTES UA: NEGATIVE
NITRITE: NEGATIVE
Protein, ur: NEGATIVE mg/dL
SPECIFIC GRAVITY, URINE: 1.008 (ref 1.005–1.030)
pH: 7 (ref 5.0–8.0)

## 2016-07-01 LAB — CBC WITH DIFFERENTIAL/PLATELET
BASOS ABS: 0.1 10*3/uL (ref 0.0–0.1)
BASOS PCT: 1 %
EOS ABS: 0 10*3/uL (ref 0.0–0.7)
Eosinophils Relative: 0 %
HCT: 46.7 % (ref 39.0–52.0)
HEMOGLOBIN: 16.1 g/dL (ref 13.0–17.0)
LYMPHS PCT: 13 %
Lymphs Abs: 1.7 10*3/uL (ref 0.7–4.0)
MCH: 30.1 pg (ref 26.0–34.0)
MCHC: 34.5 g/dL (ref 30.0–36.0)
MCV: 87.3 fL (ref 78.0–100.0)
MONOS PCT: 14 %
Monocytes Absolute: 1.8 10*3/uL — ABNORMAL HIGH (ref 0.1–1.0)
NEUTROS PCT: 72 %
Neutro Abs: 9.3 10*3/uL — ABNORMAL HIGH (ref 1.7–7.7)
Platelets: 199 10*3/uL (ref 150–400)
RBC: 5.35 MIL/uL (ref 4.22–5.81)
RDW: 12.5 % (ref 11.5–15.5)
WBC: 12.9 10*3/uL — ABNORMAL HIGH (ref 4.0–10.5)

## 2016-07-01 LAB — COMPREHENSIVE METABOLIC PANEL
ALBUMIN: 4.1 g/dL (ref 3.5–5.0)
ALT: 25 U/L (ref 17–63)
ANION GAP: 9 (ref 5–15)
AST: 24 U/L (ref 15–41)
Alkaline Phosphatase: 56 U/L (ref 38–126)
BUN: 9 mg/dL (ref 6–20)
CHLORIDE: 101 mmol/L (ref 101–111)
CO2: 29 mmol/L (ref 22–32)
Calcium: 8.7 mg/dL — ABNORMAL LOW (ref 8.9–10.3)
Creatinine, Ser: 0.77 mg/dL (ref 0.61–1.24)
GFR calc non Af Amer: >60 mL/min (ref >60)
GLUCOSE: 124 mg/dL — AB (ref 65–99)
Potassium: 3.3 mmol/L — ABNORMAL LOW (ref 3.5–5.1)
SODIUM: 139 mmol/L (ref 135–145)
Total Bilirubin: 0.3 mg/dL (ref 0.3–1.2)
Total Protein: 6.8 g/dL (ref 6.5–8.1)

## 2016-07-01 LAB — LIPASE, BLOOD: Lipase: 20 U/L (ref 11–51)

## 2016-07-01 MED ORDER — SODIUM CHLORIDE 0.9 % IV BOLUS (SEPSIS)
1000.0000 mL | Freq: Once | INTRAVENOUS | Status: AC
  Administered 2016-07-01: 1000 mL via INTRAVENOUS

## 2016-07-01 MED ORDER — PROMETHAZINE HCL 25 MG/ML IJ SOLN
12.5000 mg | Freq: Once | INTRAMUSCULAR | Status: AC
  Administered 2016-07-01: 12.5 mg via INTRAVENOUS
  Filled 2016-07-01: qty 1

## 2016-07-01 MED ORDER — HYOSCYAMINE SULFATE SL 0.125 MG SL SUBL: 0.1250 mg | SUBLINGUAL_TABLET | Freq: Four times a day (QID) | SUBLINGUAL | 0 refills | Status: AC | PRN

## 2016-07-01 MED ORDER — PROMETHAZINE HCL 25 MG RE SUPP: 25.0000 mg | Freq: Four times a day (QID) | RECTAL | 0 refills | Status: AC | PRN

## 2016-07-01 NOTE — ED Notes (Signed)
Patient transported to X-ray 

## 2016-07-01 NOTE — ED Notes (Signed)
Asked pt if he could give a urine sample, but said he's not able to because he just went.

## 2016-07-01 NOTE — Discharge Instructions (Signed)
TAKE MEDICATIONS AS PRESCRIBED. RETURN TO THE EMERGENCY DEPARTMENT IF YOU DEVELOP HIGH FEVER, SEVERE ABDOMINAL PAIN, BLOODY STOOL OR VOMIT, UNCONTROLLED VOMITING.

## 2016-11-15 ENCOUNTER — Other Ambulatory Visit: Payer: Self-pay | Admitting: General Surgery

## 2016-11-15 DIAGNOSIS — R1031 Right lower quadrant pain: Secondary | ICD-10-CM

## 2016-11-28 ENCOUNTER — Ambulatory Visit
Admission: RE | Admit: 2016-11-28 | Discharge: 2016-11-28 | Disposition: A | Discharge status: Home / Self Care | Payer: PRIVATE HEALTH INSURANCE | Source: Ambulatory Visit | Attending: General Surgery | Admitting: General Surgery

## 2016-11-28 DIAGNOSIS — R1031 Right lower quadrant pain: Secondary | ICD-10-CM

## 2019-01-10 IMAGING — CT CT ABD-PELV W/O CM
2 of 5 series · 13 of 36 positions shown, 19 images · IV contrast (READICAT/WATER)
Comparison: None.

CLINICAL DATA: Right lower quadrant pain

EXAM:
CT ABDOMEN AND PELVIS WITHOUT CONTRAST
TECHNIQUE: Multidetector CT imaging of the abdomen and pelvis was performed
following the standard protocol without IV contrast.

[Series 2: supine (id) · axial · 0.73mm/px · z∈[-466,+11]mm · 12 of 438 slices shown, 17 images]
[im 28/438  soft-tissue]
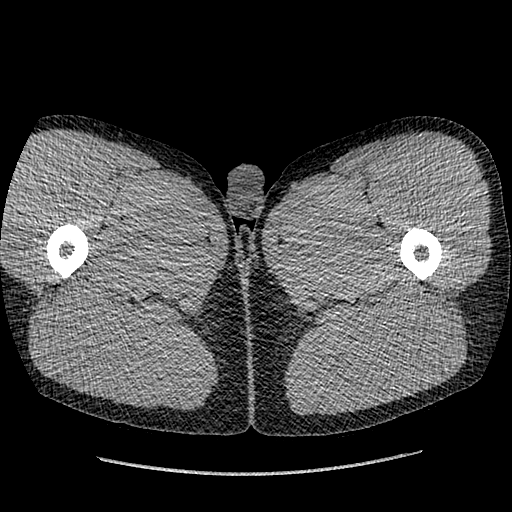
[im 28/438  bone]
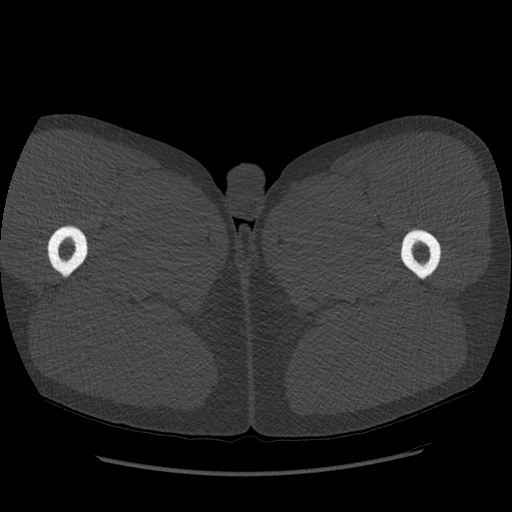
[im 82/438  soft-tissue]
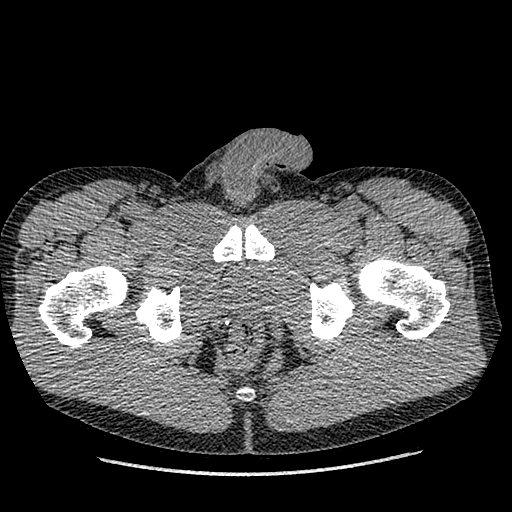
[im 110/438  soft-tissue]
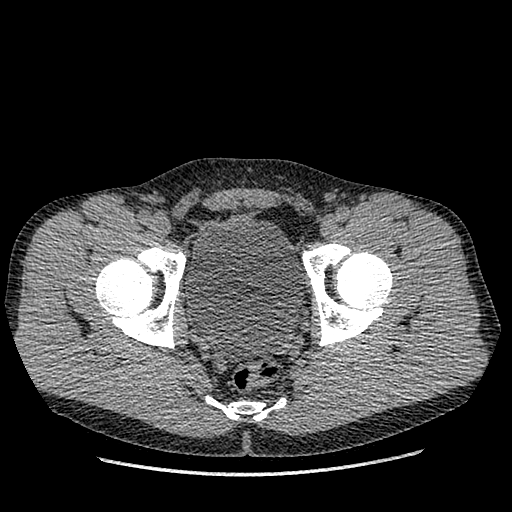
[im 137/438  soft-tissue]
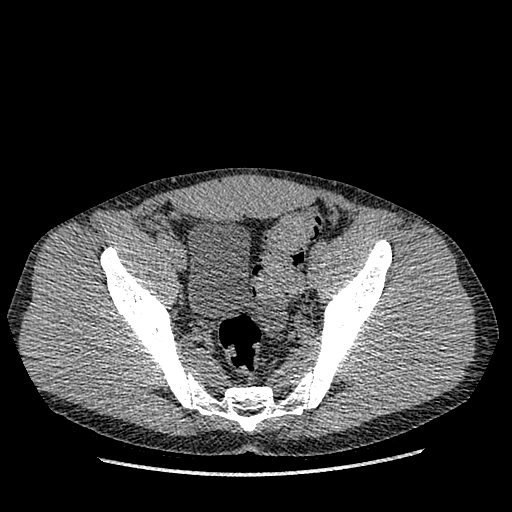
[im 192/438  soft-tissue]
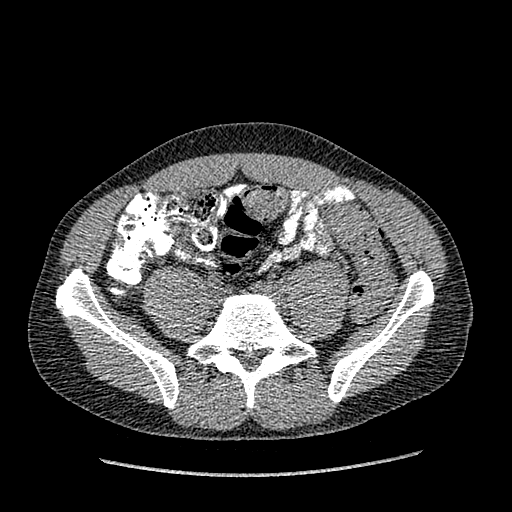
[im 219/438  soft-tissue]
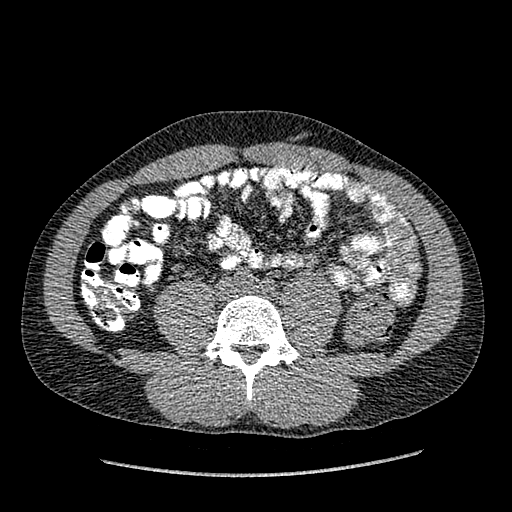
[im 246/438  soft-tissue]
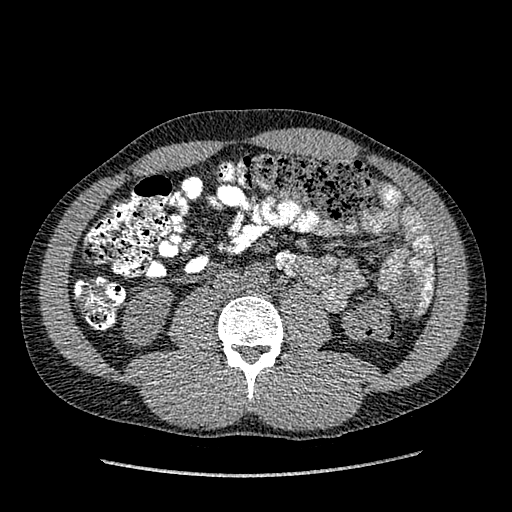
[im 301/438  soft-tissue]
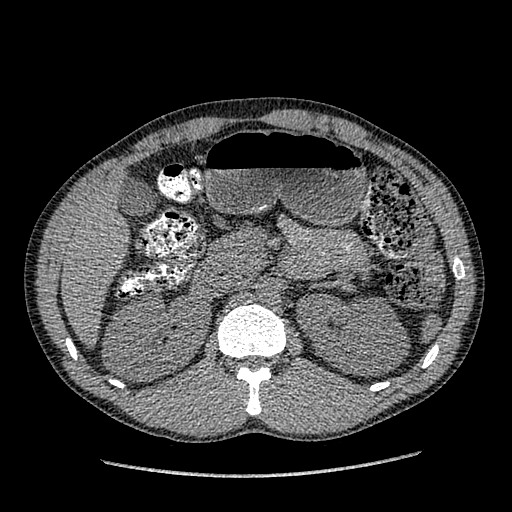
[im 328/438  soft-tissue]
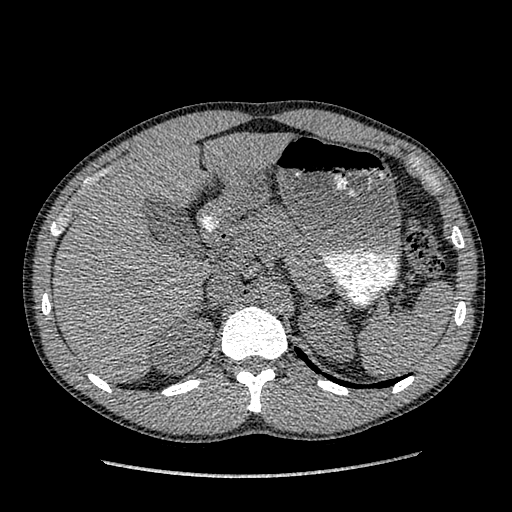
[im 328/438  lung]
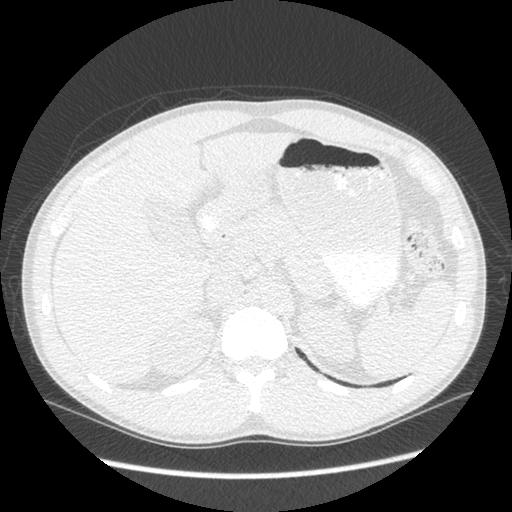
[im 328/438  bone]
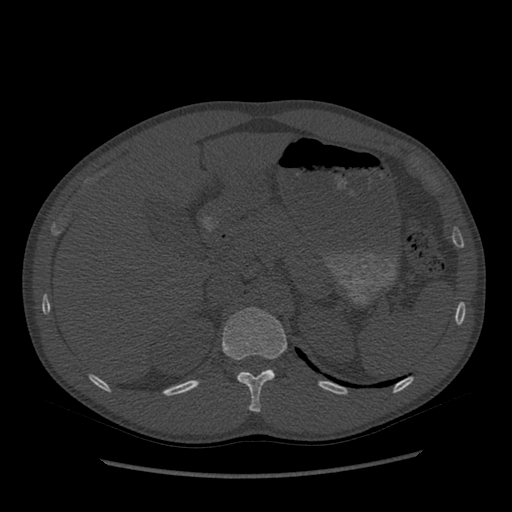
[im 356/438  soft-tissue]
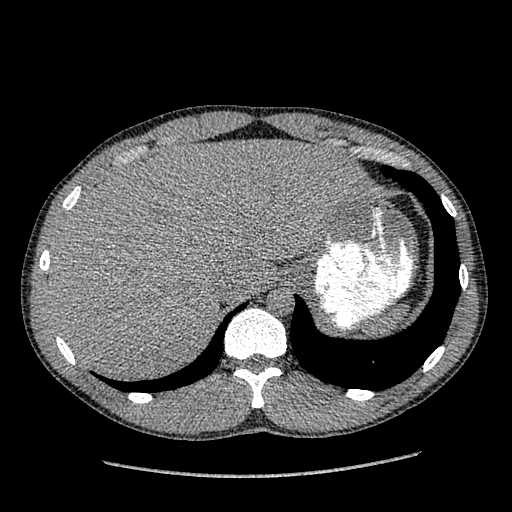
[im 356/438  lung]
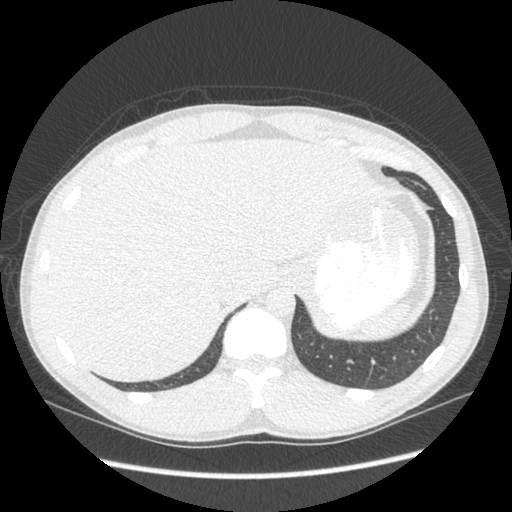
[im 383/438  lung]
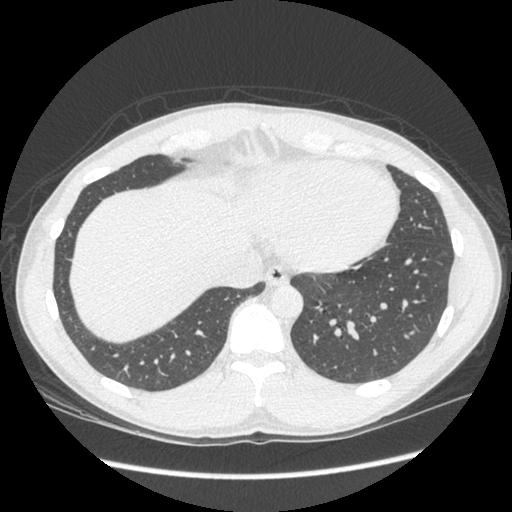
[im 410/438  soft-tissue]
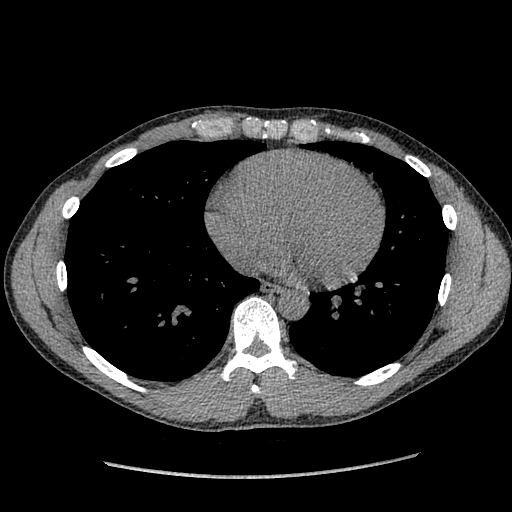
[im 410/438  lung]
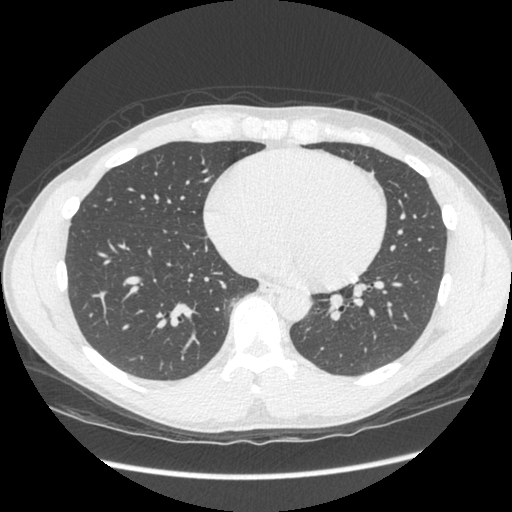

[Series 601: coronal body · coronal · 1.07mm/px · 1 of 110 slices shown, 2 images]
[im 37/110  soft-tissue]
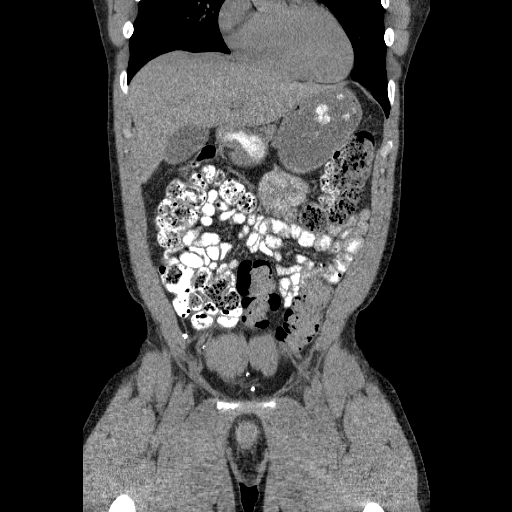
[im 37/110  bone]
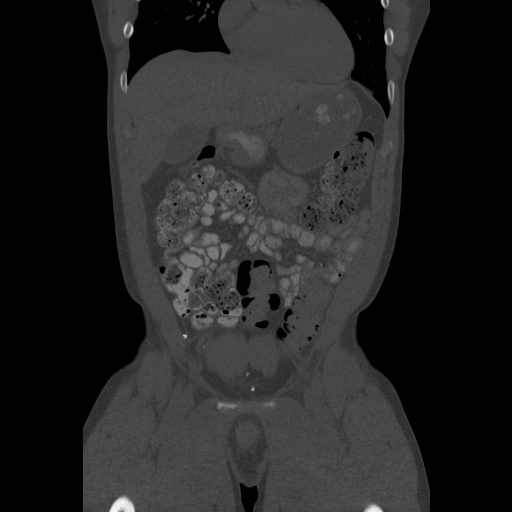

[13 of 36 positions shown; findings below may reference images not displayed]

FINDINGS: Lower chest: Lung bases are clear. No effusions. Heart is normal
size.

Hepatobiliary: No focal hepatic abnormality. Gallbladder
unremarkable.

Pancreas: No focal abnormality or ductal dilatation.

Spleen: No focal abnormality.  Normal size.

Adrenals/Urinary Tract: No adrenal abnormality. No focal renal
abnormality. No stones or hydronephrosis. Urinary bladder is
unremarkable.

Stomach/Bowel: Large stool burden throughout the colon. No evidence
of bowel obstruction. Appendix is normal.

Vascular/Lymphatic: No evidence of aneurysm or adenopathy.

Reproductive: No visible focal abnormality.

Other: No free fluid or free air. Changes of prior right inguinal
hernia repair. No recurrent or residual hernia.

Musculoskeletal: No acute bony abnormality.
IMPRESSION: No acute findings in the abdomen or pelvis.

Prior right hernia repair without visible complicating feature.

## 2023-11-14 ENCOUNTER — Other Ambulatory Visit (HOSPITAL_COMMUNITY): Payer: Self-pay | Admitting: Neurosurgery

## 2023-11-14 DIAGNOSIS — M5126 Other intervertebral disc displacement, lumbar region: Secondary | ICD-10-CM

## 2023-11-15 ENCOUNTER — Ambulatory Visit (HOSPITAL_COMMUNITY)
Admission: RE | Admit: 2023-11-15 | Discharge: 2023-11-15 | Disposition: A | Discharge status: Home / Self Care | Source: Ambulatory Visit | Attending: Neurosurgery | Admitting: Neurosurgery

## 2023-11-15 DIAGNOSIS — M5126 Other intervertebral disc displacement, lumbar region: Secondary | ICD-10-CM | POA: Insufficient documentation

## 2023-11-20 ENCOUNTER — Other Ambulatory Visit: Payer: Self-pay | Admitting: Neurosurgery

## 2023-11-20 DIAGNOSIS — M5126 Other intervertebral disc displacement, lumbar region: Secondary | ICD-10-CM

## 2023-11-26 NOTE — Discharge Instructions (Signed)

## 2023-11-27 ENCOUNTER — Ambulatory Visit
Admission: RE | Admit: 2023-11-27 | Discharge: 2023-11-27 | Disposition: A | Discharge status: Home / Self Care | Payer: PRIVATE HEALTH INSURANCE | Source: Ambulatory Visit | Attending: Neurosurgery | Admitting: Neurosurgery

## 2023-11-27 DIAGNOSIS — M5126 Other intervertebral disc displacement, lumbar region: Secondary | ICD-10-CM

## 2023-11-27 MED ORDER — METHYLPREDNISOLONE ACETATE 40 MG/ML INJ SUSP (RADIOLOG
80.0000 mg | Freq: Once | INTRAMUSCULAR | Status: AC
  Administered 2023-11-27: 80 mg via EPIDURAL

## 2023-11-27 MED ORDER — IOPAMIDOL (ISOVUE-M 200) INJECTION 41%
1.0000 mL | Freq: Once | INTRAMUSCULAR | Status: AC
  Administered 2023-11-27: 1 mL via EPIDURAL
